# Patient Record
Sex: Female | Born: 1965
Health system: Southern US, Community
[De-identification: ages and names within clinical notes are randomized; demographics above are authoritative.]

## PROBLEM LIST (undated history)

## (undated) DIAGNOSIS — G43909 Migraine, unspecified, not intractable, without status migrainosus: Secondary | ICD-10-CM

## (undated) DIAGNOSIS — J309 Allergic rhinitis, unspecified: Secondary | ICD-10-CM

## (undated) DIAGNOSIS — J069 Acute upper respiratory infection, unspecified: Secondary | ICD-10-CM

## (undated) HISTORY — PX: PARTIAL HYSTERECTOMY: SHX80

## (undated) HISTORY — DX: Acute upper respiratory infection, unspecified: J06.9

## (undated) HISTORY — PX: TONSILLECTOMY: SUR1361

## (undated) HISTORY — DX: Allergic rhinitis, unspecified: J30.9

## (undated) HISTORY — PX: ABDOMINAL HYSTERECTOMY: SHX81

## (undated) HISTORY — PX: BACK SURGERY: SHX140

## (undated) HISTORY — DX: Migraine, unspecified, not intractable, without status migrainosus: G43.909

---

## 2000-11-15 ENCOUNTER — Encounter: Payer: Self-pay | Admitting: Neurosurgery

## 2000-11-15 ENCOUNTER — Ambulatory Visit (HOSPITAL_COMMUNITY): Admission: RE | Admit: 2000-11-15 | Discharge: 2000-11-15 | Payer: Self-pay | Admitting: Neurosurgery

## 2000-12-16 ENCOUNTER — Encounter: Payer: Self-pay | Admitting: Neurosurgery

## 2000-12-16 ENCOUNTER — Inpatient Hospital Stay (HOSPITAL_COMMUNITY): Admission: RE | Admit: 2000-12-16 | Discharge: 2000-12-17 | Payer: Self-pay | Admitting: Neurosurgery

## 2001-02-01 ENCOUNTER — Encounter: Payer: Self-pay | Admitting: Neurosurgery

## 2001-02-01 ENCOUNTER — Ambulatory Visit (HOSPITAL_COMMUNITY): Admission: RE | Admit: 2001-02-01 | Discharge: 2001-02-01 | Payer: Self-pay | Admitting: Neurosurgery

## 2002-02-24 ENCOUNTER — Encounter: Admission: RE | Admit: 2002-02-24 | Discharge: 2002-02-24 | Payer: Self-pay | Admitting: Neurosurgery

## 2002-02-24 ENCOUNTER — Encounter: Payer: Self-pay | Admitting: Neurosurgery

## 2004-11-22 ENCOUNTER — Ambulatory Visit: Payer: Self-pay | Admitting: Internal Medicine

## 2006-02-08 ENCOUNTER — Other Ambulatory Visit: Payer: Self-pay

## 2006-02-08 ENCOUNTER — Emergency Department: Payer: Self-pay | Admitting: Internal Medicine

## 2009-07-26 ENCOUNTER — Ambulatory Visit: Payer: Self-pay | Admitting: Unknown Physician Specialty

## 2009-07-31 ENCOUNTER — Ambulatory Visit: Payer: Self-pay | Admitting: Unknown Physician Specialty

## 2009-10-31 ENCOUNTER — Ambulatory Visit: Payer: Self-pay | Admitting: Neurosurgery

## 2009-12-02 ENCOUNTER — Ambulatory Visit: Payer: Self-pay | Admitting: Anesthesiology

## 2009-12-10 ENCOUNTER — Ambulatory Visit: Payer: Self-pay | Admitting: Anesthesiology

## 2009-12-12 ENCOUNTER — Emergency Department: Payer: Self-pay | Admitting: Emergency Medicine

## 2010-01-02 ENCOUNTER — Ambulatory Visit: Payer: Self-pay | Admitting: Anesthesiology

## 2010-02-03 ENCOUNTER — Ambulatory Visit: Payer: Self-pay | Admitting: Anesthesiology

## 2010-03-11 ENCOUNTER — Ambulatory Visit: Payer: Self-pay | Admitting: Anesthesiology

## 2010-04-16 ENCOUNTER — Ambulatory Visit: Payer: Self-pay | Admitting: Anesthesiology

## 2010-05-06 ENCOUNTER — Ambulatory Visit: Payer: Self-pay | Admitting: Anesthesiology

## 2010-05-20 ENCOUNTER — Ambulatory Visit: Payer: Self-pay | Admitting: Anesthesiology

## 2010-06-10 ENCOUNTER — Ambulatory Visit: Payer: Self-pay | Admitting: Anesthesiology

## 2010-07-14 ENCOUNTER — Ambulatory Visit: Payer: Self-pay | Admitting: Anesthesiology

## 2010-08-06 ENCOUNTER — Ambulatory Visit: Payer: Self-pay | Admitting: Anesthesiology

## 2010-09-04 ENCOUNTER — Ambulatory Visit: Payer: Self-pay | Admitting: Anesthesiology

## 2010-09-17 ENCOUNTER — Ambulatory Visit: Payer: Self-pay | Admitting: Anesthesiology

## 2010-12-16 ENCOUNTER — Ambulatory Visit: Payer: Self-pay | Admitting: Anesthesiology

## 2011-02-09 ENCOUNTER — Ambulatory Visit: Payer: Self-pay | Admitting: Pain Medicine

## 2011-02-16 ENCOUNTER — Ambulatory Visit: Payer: Self-pay | Admitting: Pain Medicine

## 2011-03-04 ENCOUNTER — Ambulatory Visit: Payer: Self-pay | Admitting: Pain Medicine

## 2011-04-07 ENCOUNTER — Ambulatory Visit: Payer: Self-pay | Admitting: Pain Medicine

## 2011-05-07 ENCOUNTER — Ambulatory Visit: Payer: Self-pay | Admitting: Pain Medicine

## 2012-03-22 ENCOUNTER — Ambulatory Visit: Payer: Self-pay | Admitting: Otolaryngology

## 2012-11-21 DIAGNOSIS — E785 Hyperlipidemia, unspecified: Secondary | ICD-10-CM | POA: Insufficient documentation

## 2012-12-05 DIAGNOSIS — M533 Sacrococcygeal disorders, not elsewhere classified: Secondary | ICD-10-CM | POA: Insufficient documentation

## 2012-12-05 DIAGNOSIS — M961 Postlaminectomy syndrome, not elsewhere classified: Secondary | ICD-10-CM | POA: Insufficient documentation

## 2012-12-05 DIAGNOSIS — M542 Cervicalgia: Secondary | ICD-10-CM | POA: Insufficient documentation

## 2013-02-02 ENCOUNTER — Ambulatory Visit: Payer: Self-pay | Admitting: Otolaryngology

## 2013-02-09 DIAGNOSIS — D72829 Elevated white blood cell count, unspecified: Secondary | ICD-10-CM | POA: Insufficient documentation

## 2013-02-09 DIAGNOSIS — J309 Allergic rhinitis, unspecified: Secondary | ICD-10-CM | POA: Insufficient documentation

## 2013-02-28 ENCOUNTER — Encounter: Payer: Self-pay | Admitting: *Deleted

## 2013-02-28 ENCOUNTER — Ambulatory Visit (INDEPENDENT_AMBULATORY_CARE_PROVIDER_SITE_OTHER): Payer: 59 | Admitting: Pulmonary Disease

## 2013-02-28 ENCOUNTER — Encounter: Payer: Self-pay | Admitting: Pulmonary Disease

## 2013-02-28 VITALS — BP 108/70 | HR 110 | Temp 98.4°F | Ht 64.0 in | Wt 129.0 lb

## 2013-02-28 DIAGNOSIS — R059 Cough, unspecified: Secondary | ICD-10-CM

## 2013-02-28 DIAGNOSIS — R011 Cardiac murmur, unspecified: Secondary | ICD-10-CM

## 2013-02-28 DIAGNOSIS — R05 Cough: Secondary | ICD-10-CM | POA: Insufficient documentation

## 2013-02-28 NOTE — Assessment & Plan Note (Addendum)
I agree with Dr. Andee Poles that Megan Melendez's cough is likely due to cyclical or habit cough exacerbated by allergic rhinitis. I am encouraged by the fact that she had completely normal lung function tests today, ruling out airflow obstruction, her exam was normal, and a recent chest x-ray was completely normal.  Notably, she coughed only when I was in the room and really had no cough when I was not present. Further, when I instructed her to stop coughing she is able to do this quite easily. She also notes that if she lays quietly at home she is able to stop the cough.  Is not uncommon for cough to cause further cough in this scenario do to ongoing laryngeal irritation.  Plan: -Strict voice rest for at least 3 days -Tessalon Perles and Delsym to help with cough suppression during this time -Hard candies, warm beverages to help keep her throat moist -Substitute her current antihistamine with chlorpheniramine/phenylephrine temporarily during voice rest -Continue antiallergy medicine otherwise dictated by Dr. Andee Poles -Followup with me in 4-6 weeks if no improvement

## 2013-02-28 NOTE — Progress Notes (Signed)
Subjective:    Patient ID: Megan Melendez, female    DOB: 1966/08/05, 47 y.o.   MRN: [id number]  HPI  This is a very pleasant 47 year old female who comes to our clinic today for evaluation of ongoing cough. She had a normal childhood without respiratory illnesses but around age 75 she developed allergic rhinitis. Since then she's been treating this with various antihistamines. Approximately 10 years ago she developed a cough which lasted for several months. Notably, this was after a very cold winter. She was seen by pulmonologist multiple times and underwent multiple pulmonary function testing and was given multiple rounds of prednisone. She says she thinks that the pulmonologist diagnosed her with asthma but she's not sure. After several months the symptoms went away. Since then she has had recurrent bouts of bronchitis every few years requiring antibiotics. Up until 2014 the last 2-3 years have actually been not too bad for her in this regard.  3 months prior to this visit she developed the sudden onset of cough, shortness of breath, and chest tightness. This symptom progressed and was associated with significant postnasal drip, sinus congestion, and runny nose. She's been seen by her ear nose and throat physician here history her with prednisone, intramuscular Solu-Medrol, and inhaled corticosteroids/long acting bronchodilator. She states that her cough has improved somewhat but is still bothersome to her. She coughs all day long, and frequently at night. She states that there was one cough medication that helped her feel better but she can't recall the name. She knows that she has tried [employer] and test next in the last several weeks. Notably, she states that she can suppress the cough if she lies still at night and rests her voice.  She continues to have ongoing postnasal drip and sinus congestion despite taking antihistamines, nasal steroids, and nasal antihistamines. She does not have  significant shortness of breath anymore but she did have some when the cough was worse there she had a particularly lengthy coughing spell. She has not had chest pain, fever, or weight loss.  She has been around multiple coworkers who have been sick with similar symptoms.  Past Medical History  Diagnosis Date  . Migraine   . Allergic rhinitis      History reviewed. No pertinent family history.   History   Social History  . Marital Status: Legally Separated    Spouse Name: N/A    Number of Children: N/A  . Years of Education: N/A   Occupational History  . Not on file.   Social History Main Topics  . Smoking status: Never Smoker   . Smokeless tobacco: Never Used  . Alcohol Use: No  . Drug Use: No  . Sexually Active: Not on file   Other Topics Concern  . Not on file   Social History Narrative  . No narrative on file     Allergies  Allergen Reactions  . Amoxicillin   . Codeine   . Tramadol      No outpatient prescriptions prior to visit.   No facility-administered medications prior to visit.      Review of Systems  Constitutional: Negative for fever and unexpected weight change.  HENT: Positive for rhinorrhea. Negative for ear pain, nosebleeds, congestion, sore throat, sneezing, trouble swallowing, dental problem, postnasal drip and sinus pressure.   Eyes: Negative for redness and itching.  Respiratory: Positive for cough, chest tightness and shortness of breath. Negative for wheezing.   Cardiovascular: Negative for palpitations and leg swelling.  Gastrointestinal: Negative for nausea and vomiting.  Genitourinary: Negative for dysuria.  Musculoskeletal: Negative for joint swelling.  Skin: Negative for rash.  Neurological: Negative for headaches.  Hematological: Does not bruise/bleed easily.  Psychiatric/Behavioral: Negative for dysphoric mood. The patient is not nervous/anxious.        Objective:   Physical Exam  Filed Vitals:   02/28/13 1500  BP:  108/70  Pulse: 110  Temp: 98.4 F (36.9 C)  TempSrc: Oral  Height: 5\' 4"  (1.626 m)  Weight: 129 lb (58.514 kg)  SpO2: 98%   Gen: frequent cough when I am in the room, clears when I leave, well appearing, no acute distress HEENT: NCAT, PERRL, EOMi, OP clear, neck supple without masses PULM: CTA B CV: RRR, late systolic murmur which increases with inspiration heard best over the third intercostal space in the left, no JVD AB: BS+, soft, nontender, no hsm Ext: warm, no edema, no clubbing, no cyanosis Derm: no rash or skin breakdown Neuro: A&Ox4, CN II-XII intact, strength 5/5 in all 4 extremities  02/28/2013 simple spirometry normal May 2014 chest x-ray normal     Assessment & Plan:   Cough I agree with Dr. Andee Poles that Megan Melendez's cough is likely due to cyclical or habit cough exacerbated by allergic rhinitis. I am encouraged by the fact that she had completely normal lung function tests today, ruling out airflow obstruction, her exam was normal, and a recent chest x-ray was completely normal.  Notably, she coughed only when I was in the room and really had no cough when I was not present. Further, when I instructed her to stop coughing she is able to do this quite easily. She also notes that if she lays quietly at home she is able to stop the cough.  Is not uncommon for cough to cause further cough in this scenario do to ongoing laryngeal irritation.  Plan: -Strict voice rest for at least 3 days -Tessalon Perles and Delsym to help with cough suppression during this time -Hard candies, warm beverages to help keep her throat moist -Substitute her current antihistamine with chlorpheniramine/phenylephrine temporarily during voice rest -Continue antiallergy medicine otherwise dictated by Dr. Andee Poles -Followup with me in 4-6 weeks if no improvement  Heart murmur Gilberto has what sounds like a tricuspid regurg murmur on physical exam today. This is unlikely to be related to her cough and may  in fact turn out to be nothing. She has never had an echocardiogram to evaluate this so we will order that study.    Updated Medication List Outpatient Encounter Prescriptions as of 02/28/2013  Medication Sig Dispense Refill  . albuterol (PROVENTIL HFA;VENTOLIN HFA) 108 (90 BASE) MCG/ACT inhaler Inhale 2 puffs into the lungs every 6 (six) hours as needed for wheezing.      Marland Kitchen ALPRAZolam (XANAX) 0.25 MG tablet Take 0.25 mg by mouth as needed for sleep.      . Azelastine-Fluticasone (DYMISTA) 137-50 MCG/ACT SUSP Place 2 puffs into the nose daily.      . cyclobenzaprine (FLEXERIL) 10 MG tablet Take 10 mg by mouth 3 (three) times daily as needed for muscle spasms.      Marland Kitchen HYDROcodone-acetaminophen (NORCO/VICODIN) 5-325 MG per tablet Take 1 tablet by mouth every 6 (six) hours as needed for pain.      Marland Kitchen levocetirizine (XYZAL) 5 MG tablet Take 5 mg by mouth every evening.      . mometasone-formoterol (DULERA) 200-5 MCG/ACT AERO Inhale 2 puffs into the lungs.      Marland Kitchen  montelukast (SINGULAIR) 10 MG tablet Take 10 mg by mouth at bedtime.      . SUMAtriptan (IMITREX) 100 MG tablet Take 100 mg by mouth every 2 (two) hours as needed for migraine.      . Tapentadol HCl (NUCYNTA) 100 MG TABS Take 1 tablet by mouth daily.      Marland Kitchen topiramate (TOPAMAX) 100 MG tablet Take 100 mg by mouth daily.      Marland Kitchen zolpidem (AMBIEN) 10 MG tablet Take 10 mg by mouth at bedtime as needed for sleep.       No facility-administered encounter medications on file as of 02/28/2013.

## 2013-02-28 NOTE — Assessment & Plan Note (Signed)
Megan Melendez has what sounds like a tricuspid regurg murmur on physical exam today. This is unlikely to be related to her cough and may in fact turn out to be nothing. She has never had an echocardiogram to evaluate this so we will order that study.

## 2013-02-28 NOTE — Patient Instructions (Signed)
You need to break the couging cycle:  Take three days to minimize talking and actively suppress your cough Use the tessalon perles in addition to delsym (over the counter) at home to help suppress your cough During this time substitute the Xysal pills for chlorpheniramine/phenylephrine combination pills used every 4-6 hours as needed for sinus congestion and cough  Use hard candies and warm beverages to sooth your voice  After three days of strict voice rest and cough suppression, you can gradually increase your voice   We will see you back in one month or sooner if needed

## 2013-03-01 ENCOUNTER — Encounter: Payer: Self-pay | Admitting: Pulmonary Disease

## 2013-03-01 ENCOUNTER — Encounter: Payer: Self-pay | Admitting: *Deleted

## 2013-03-02 ENCOUNTER — Other Ambulatory Visit: Payer: 59

## 2013-03-10 ENCOUNTER — Other Ambulatory Visit: Payer: 59

## 2013-03-15 ENCOUNTER — Telehealth: Payer: Self-pay | Admitting: Pulmonary Disease

## 2013-03-15 NOTE — Telephone Encounter (Signed)
ATC PT NA received message the VM has not been set up yet. WCB

## 2013-03-16 ENCOUNTER — Encounter: Payer: Self-pay | Admitting: Internal Medicine

## 2013-03-16 ENCOUNTER — Ambulatory Visit (INDEPENDENT_AMBULATORY_CARE_PROVIDER_SITE_OTHER)
Admission: RE | Admit: 2013-03-16 | Discharge: 2013-03-16 | Disposition: A | Discharge status: Home / Self Care | Payer: 59 | Source: Ambulatory Visit | Attending: Internal Medicine | Admitting: Internal Medicine

## 2013-03-16 ENCOUNTER — Ambulatory Visit (INDEPENDENT_AMBULATORY_CARE_PROVIDER_SITE_OTHER): Payer: 59 | Admitting: Internal Medicine

## 2013-03-16 ENCOUNTER — Encounter: Payer: Self-pay | Admitting: *Deleted

## 2013-03-16 VITALS — BP 118/80 | HR 110 | Temp 97.8°F | Ht 63.5 in | Wt 129.0 lb

## 2013-03-16 DIAGNOSIS — R059 Cough, unspecified: Secondary | ICD-10-CM

## 2013-03-16 DIAGNOSIS — R05 Cough: Secondary | ICD-10-CM

## 2013-03-16 MED ORDER — HYDROCODONE-ACETAMINOPHEN 5-325 MG PO TABS: 1.0000 | ORAL_TABLET | ORAL | Status: DC | PRN

## 2013-03-16 MED ORDER — PREDNISONE (PAK) 10 MG PO TABS: ORAL_TABLET | ORAL | Status: DC

## 2013-03-16 MED ORDER — FAMOTIDINE 20 MG PO TABS: ORAL_TABLET | ORAL | Status: DC

## 2013-03-16 MED ORDER — PANTOPRAZOLE SODIUM 40 MG PO TBEC: 40.0000 mg | DELAYED_RELEASE_TABLET | Freq: Every day | ORAL | Status: DC

## 2013-03-16 NOTE — Telephone Encounter (Signed)
Voicemail box not set up--unable to leave msg @home  # Work # not in service.  WCB

## 2013-03-16 NOTE — Telephone Encounter (Signed)
Pt called again. Apparently her contact # was incorrect. Please call [phone] asap "still coughing a lot". Megan Melendez

## 2013-03-16 NOTE — Patient Instructions (Signed)
Please remember to go to the  x-ray department downstairs for your tests - we will call you with the results when they are available.  The key to effective treatment for your cough is eliminating the non-stop cycle of cough you're stuck in long enough to let your airway heal completely and then see if there is anything still making you cough once you stop the cough suppression, but this should take no more than 5 days to figure out  First take delsym two tsp every 12 hours and supplement if needed with  tramadol 50 mg up to 2 every 4 hours to suppress the urge to cough at all or even clear your throat. Swallowing water or using ice chips/non mint and menthol containing candies (such as lifesavers or sugarless jolly ranchers) are also effective.  You should rest your voice and avoid activities that you know make you cough.  Once you have eliminated the cough for 3 straight days try reducing the tramadol first,  then the delsym as tolerated.    Try prilosec 20mg   Take 30-60 min before first meal of the day and Pepcid 20 mg one bedtime until cough is completely gone for at least a week without the need for cough suppression  I think of reflux for chronic cough like I do oxygen for fire (doesn't cause the fire but once you get the oxygen suppressed it usually goes away regardless of the exact cause).  GERD (REFLUX)  is an extremely common cause of respiratory symptoms, many times with no significant heartburn at all.    It can be treated with medication, but also with lifestyle changes including avoidance of late meals, excessive alcohol, smoking cessation, and avoid fatty foods, chocolate, peppermint, colas, red wine, and acidic juices such as orange juice.  NO MINT OR MENTHOL PRODUCTS SO NO COUGH DROPS  USE SUGARLESS CANDY INSTEAD (jolley ranchers or Stover's)  NO OIL BASED VITAMINS - use powdered substitutes.    Prednisone 10 mg take  4 each am x 2 days,   2 each am x 2 days,  1 each am x 2 days  and stop

## 2013-03-16 NOTE — Telephone Encounter (Signed)
ATC pt and received message the VM has not been set up yet. WCB

## 2013-03-16 NOTE — Progress Notes (Signed)
Subjective:    Patient ID: Megan Melendez, female    DOB: [birth year], 47 y.o.   MRN: [id number]   Brief patient profile:  47 yowf never smoker  around age 26 she developed allergic rhinitis. Since then she's been treating this with various antihistamines. Approximately 10 years ago she developed a cough which lasted for several months. Notably, this was after a very cold winter. She was seen by [facility] multiple times and underwent multiple pulmonary function testing and was given multiple rounds of prednisone. She says she thinks that the pulmonologist diagnosed her with asthma but she's not sure. After several months the symptoms went away. Since then she has had recurrent bouts of bronchitis every few years requiring antibiotics.     02/28/13 initial eval Megan Melendez 3 months prior to  visit she developed the sudden onset of cough, shortness of breath, and chest tightness. This symptom progressed and was associated with significant postnasal drip, sinus congestion, and runny nose. She's been seen by her ear nose and throat physician here history her with prednisone, intramuscular Solu-Medrol, and inhaled corticosteroids/long acting bronchodilator. She states that her cough has improved somewhat but is still bothersome to her. She coughs all day long, and frequently at night. She states that there was one cough medication that helped her feel better but she can't recall the name. She knows that she has tried [employer] and test next in the last several weeks. Notably, she states that she can suppress the cough if she lies still at night and rests her voice. rec You need to break the coughing cycle: Take three days to minimize talking and actively suppress your cough Use the tessalon perles in addition to delsym (over the counter) at home to help suppress your cough During this time substitute the Xysal pills for chlorpheniramine/phenylephrine combination pills used every 4-6 hours as needed for sinus  congestion and cough Use hard candies and warm beverages to sooth your voice After three days of strict voice rest and cough suppression, you can gradually increase your voice    03/16/2013 acute  ov/Megan Melendez   Chief Complaint  Patient presents with  . Acute Visit    Pt c/o increased cough for the past wk. She also c/o left side pain for the past 2 days.    cough only marginally better, remains dry, honking quality, around the clock assoc with urinary incont. No sob unless coughing.  Cp is continuous but much worse with coughing and localized along Lat lower chest wall  No obvious daytime variabilty or assoc   chest tightness, subjective wheeze overt sinus or hb symptoms. No unusual exp hx or h/o childhood pna/ asthma or knowledge of premature birth.    Past Medical History  Diagnosis Date  . Migraine   . Allergic rhinitis         History   Social History  . Marital Status: Legally Separated    Spouse Name: N/A    Number of Children: N/A  . Years of Education: N/A   Occupational History  . Not on file.   Social History Main Topics  . Smoking status: Never Smoker   . Smokeless tobacco: Never Used  . Alcohol Use: No  . Drug Use: No  . Sexually Active: Not on file   Other Topics Concern  . Not on file   Social History Narrative  . No narrative on file     Allergies  Allergen Reactions  . Amoxicillin   . Codeine   .  Tramadol               Objective:   Physical Exam  Distraught amb wf clutching her L chest when coughing  HEENT: NCAT, PERRL, EOMi, OP clear, neck supple without masses PULM:  Completely clear bilaterally  CV: RRR, late systolic murmur which increases with inspiration heard best over the third intercostal space in the left, no JVD AB: BS+, soft, nontender, no hsm Ext: warm, no edema, no clubbing, no cyanosis Derm: no rash or skin breakdown Neuro: A&Ox4, CN II-XII intact, strength 5/5 in all 4 extremities  02/28/2013 simple spirometry  normal May 2014 chest x-ray normal  CXR  03/16/2013 : No active disease       Assessment & Plan:

## 2013-03-16 NOTE — Assessment & Plan Note (Addendum)
The most common causes of chronic cough in immunocompetent adults include the following: upper airway cough syndrome (UACS), previously referred to as postnasal drip syndrome (PNDS), which is caused by variety of rhinosinus conditions; (2) asthma; (3) GERD; (4) chronic bronchitis from cigarette smoking or other inhaled environmental irritants; (5) nonasthmatic eosinophilic bronchitis; and (6) bronchiectasis.   These conditions, singly or in combination, have accounted for up to 94% of the causes of chronic cough in prospective studies.   Other conditions have constituted no >6% of the causes in prospective studies These have included bronchogenic carcinoma, chronic interstitial pneumonia, sarcoidosis, left ventricular failure, ACEI-induced cough, and aspiration from a condition associated with pharyngeal dysfunction.    Chronic cough is often simultaneously caused by more than one condition. A single cause has been found from 38 to 82% of the time, multiple causes from 18 to 62%. Multiply caused cough has been the result of three diseases up to 42% of the time.       This is almost certainly  Classic Upper airway cough syndrome, so named because it's frequently impossible to sort out how much is  CR/sinusitis with freq throat clearing (which can be related to primary GERD)   vs  causing  secondary (" extra esophageal")  GERD from wide swings in gastric pressure that occur with throat clearing, often  promoting self use of mint and menthol lozenges that reduce the lower esophageal sphincter tone and exacerbate the problem further in a cyclical fashion.   These are the same pts (now being labeled as having "irritable larynx syndrome" by some cough centers) who not infrequently have a history of having failed to tolerate ace inhibitors,  dry powder inhalers or biphosphonates or report having atypical reflux symptoms that don't respond to standard doses of PPI , and are easily confused as having aecopd or  asthma flares by even experienced allergists/ pulmonologists.   For now try to eliminate gerd/ cyclical cough x at least 3 days (completely) then regroup on 6/23 if not able to return to work  See instructions for specific recommendations which were reviewed directly with the patient who was given a copy with highlighter outlining the key components.

## 2013-03-16 NOTE — Telephone Encounter (Signed)
i called and spoke with pt. She was coughing the entire time were on the phone. I scheduled her an appt w/ MW at 2:45 in [place] office. She is aware of location.address. Nothing further was needed

## 2013-03-17 ENCOUNTER — Telehealth: Payer: Self-pay | Admitting: Internal Medicine

## 2013-03-17 NOTE — Telephone Encounter (Signed)
Notes Recorded by Nyoka Cowden, MD on 03/16/2013 at 4:38 PM Call pt: Reviewed cxr and no acute change so no change in recommendations made at ov   I spoke with patient about results and she verbalized understanding and had no questions

## 2013-03-28 ENCOUNTER — Other Ambulatory Visit (INDEPENDENT_AMBULATORY_CARE_PROVIDER_SITE_OTHER): Payer: 59

## 2013-03-28 ENCOUNTER — Other Ambulatory Visit: Payer: Self-pay

## 2013-03-28 DIAGNOSIS — R011 Cardiac murmur, unspecified: Secondary | ICD-10-CM

## 2013-03-29 ENCOUNTER — Encounter: Payer: Self-pay | Admitting: Pulmonary Disease

## 2013-04-04 ENCOUNTER — Ambulatory Visit (INDEPENDENT_AMBULATORY_CARE_PROVIDER_SITE_OTHER): Payer: 59 | Admitting: Pulmonary Disease

## 2013-04-04 VITALS — BP 102/72 | HR 88 | Ht 63.5 in | Wt 131.0 lb

## 2013-04-04 DIAGNOSIS — R0789 Other chest pain: Secondary | ICD-10-CM

## 2013-04-04 DIAGNOSIS — R05 Cough: Secondary | ICD-10-CM

## 2013-04-04 DIAGNOSIS — R059 Cough, unspecified: Secondary | ICD-10-CM

## 2013-04-04 NOTE — Progress Notes (Signed)
Subjective:    Patient ID: Megan Melendez, female    DOB: [birth year], 47 y.o.   MRN: [id number]  45 yowf never smoker  around age 70 she developed allergic rhinitis. Since then she's been treating this with various antihistamines. Approximately 10 years ago she developed a cough which lasted for several months. Notably, this was after a very cold winter. She was seen by [facility] multiple times and underwent multiple pulmonary function testing and was given multiple rounds of prednisone. She says she thinks that the pulmonologist diagnosed her with asthma but she's not sure. After several months the symptoms went away. Since then she has had recurrent bouts of bronchitis every few years requiring antibiotics.    HPI  04/04/2013 ROV >> She says that her cough is better since seeing Dr. Sherene Sires.  She was given prednisone, GERD therapy, and was told again to have voice rest.  She stated that the cough nearly completely stopped during the period of voice rest but then started back gradually after she failed to refill her singulair.  Overall the cough is better. Still has nagging pain in her left chest with cough.  Past Medical History  Diagnosis Date  . Migraine   . Allergic rhinitis      Review of Systems     Objective:   Physical Exam Filed Vitals:   04/04/13 0947  BP: 102/72  Pulse: 88  Height: 5' 3.5" (1.613 m)  Weight: 131 lb (59.421 kg)  SpO2: 100%   Gen: well appearing, no acute distress HEENT: NCAT, EOMi, OP clear, PULM: CTA B Chest: point tenderness mid axillary line, approx 5th intercostal space CV: RRR, slight systolic murmur, no JVD AB: BS+, soft, nontender, no hsm Ext: warm, no edema, no clubbing, no cyanosis        Assessment & Plan:   Cough Due to reflux, post nasal drip, and cyclical cough.  She is sniffling in the office today because she stopped her singulair and hence the cough is worse  I educated her on the importance of medication compliance with the GERD and  allergic rhinitis meds.  Plan: -refill singulair (Rx up to date, she hasn't requested a refill) -continue GERD therapy -actively suppress cough -GERD lifestyle modifications  Other chest pain This is musculoskeletal in nature based on normal CXR and tenderness on exam.  Likely due to coughing.  Plan: -scheduled ibuprofen for three days then prn -watch for upset stomach on ibuprofen -stop coughing -use pillow with cough   Updated Medication List Outpatient Encounter Prescriptions as of 04/04/2013  Medication Sig Dispense Refill  . albuterol (PROVENTIL HFA;VENTOLIN HFA) 108 (90 BASE) MCG/ACT inhaler Inhale 2 puffs into the lungs every 6 (six) hours as needed for wheezing.      Marland Kitchen ALPRAZolam (XANAX) 0.25 MG tablet Take 0.25 mg by mouth as needed for sleep.      . Azelastine-Fluticasone (DYMISTA) 137-50 MCG/ACT SUSP Place 2 puffs into the nose daily.      . cyclobenzaprine (FLEXERIL) 10 MG tablet Take 10 mg by mouth 3 (three) times daily as needed for muscle spasms.      . famotidine (PEPCID) 20 MG tablet One at bedtime  30 tablet  0  . HYDROcodone-acetaminophen (NORCO/VICODIN) 5-325 MG per tablet Take 1 tablet by mouth every 4 (four) hours as needed for pain.  30 tablet  0  . montelukast (SINGULAIR) 10 MG tablet Take 10 mg by mouth at bedtime.      . pantoprazole (PROTONIX) 40 MG  tablet Take 1 tablet (40 mg total) by mouth daily. Take 30-60 min before first meal of the day  30 tablet  2  . SUMAtriptan (IMITREX) 100 MG tablet Take 100 mg by mouth every 2 (two) hours as needed for migraine.      . topiramate (TOPAMAX) 100 MG tablet Take 100 mg by mouth daily.      Marland Kitchen zolpidem (AMBIEN) 10 MG tablet Take 10 mg by mouth at bedtime as needed for sleep.      . [DISCONTINUED] predniSONE (STERAPRED UNI-PAK) 10 MG tablet Prednisone 10 mg take  4 each am x 2 days,   2 each am x 2 days,  1 each am x2days and stop  14 tablet  0   No facility-administered encounter medications on file as of 04/04/2013.

## 2013-04-04 NOTE — Assessment & Plan Note (Signed)
This is musculoskeletal in nature based on normal CXR and tenderness on exam.  Likely due to coughing.  Plan: -scheduled ibuprofen for three days then prn -watch for upset stomach on ibuprofen -stop coughing -use pillow with cough

## 2013-04-04 NOTE — Patient Instructions (Signed)
Refill your singulair Use ibuprofen for the chest wall pain and let us know if it does not get better Keep taking the reflux medicines Follow the reflux lifestyle modification handout  Actively try to suppress your cough.  We will see you back in 6 months or sooner if needed.  Let us know if the chest pain does not go away and we will see you back sooner

## 2013-04-04 NOTE — Assessment & Plan Note (Signed)
Due to reflux, post nasal drip, and cyclical cough.  She is sniffling in the office today because she stopped her singulair and hence the cough is worse  I educated her on the importance of medication compliance with the GERD and allergic rhinitis meds.  Plan: -refill singulair (Rx up to date, she hasn't requested a refill) -continue GERD therapy -actively suppress cough -GERD lifestyle modifications

## 2013-04-10 ENCOUNTER — Telehealth: Payer: Self-pay | Admitting: Pulmonary Disease

## 2013-04-10 NOTE — Telephone Encounter (Signed)
Lillia Abed, I think you worked with him on 7-8. Did you receive any FMLA forms from this pt? Were they sent to [employer]? Thanks. Carron Curie, CMA

## 2013-04-11 NOTE — Telephone Encounter (Signed)
Dr Sherene Sires wants McQuaid to fill out the forms, I placed them in his lookat this am

## 2013-04-11 NOTE — Telephone Encounter (Signed)
Per Dr. Kendrick Fries, MW was supposed to fill these papers out. I brought them back to the [place] office on 04/05/2013 and put them in [facility] box.  Verlon Au - can you locate these and have MW fill these out. Thanks.

## 2013-04-11 NOTE — Telephone Encounter (Signed)
Paperwork has been filled out. This will be faxed in today. Pt is aware. Nothing further was needed.

## 2013-04-14 ENCOUNTER — Telehealth: Payer: Self-pay | Admitting: Internal Medicine

## 2013-04-14 NOTE — Telephone Encounter (Signed)
LMTCB

## 2013-04-17 NOTE — Telephone Encounter (Signed)
[email or web address]  

## 2013-04-18 NOTE — Telephone Encounter (Signed)
ATC pt no answer. [email or web address] 

## 2013-04-19 NOTE — Telephone Encounter (Signed)
I spoke with the Megan Melendez and she is upset because what was written on her FMLA caused her to get denied from work for her time off due to her cough. She states when she saw MW in June for an acute visit she told him she would need fmla forms completed and she states he said no problem. However the Megan Melendez did not get forms to [place] until her f/u with BQ(her primary pulmonary doctor) on 03-30-13. Dr. Kendrick Fries completed the forms but did not feel the Megan Melendez needed FMLA due to cough. The Megan Melendez states she has missed so much work due to appts before this that she was close to losing her job as it is and now she does not know what will happen. I asked what we could do and she states she does not know. She has a meeting with HR and will ask them if anything can be done. I advised her to let us know. Carron Curie, CMA

## 2013-08-03 ENCOUNTER — Other Ambulatory Visit: Payer: Self-pay

## 2013-10-19 DIAGNOSIS — Z85828 Personal history of other malignant neoplasm of skin: Secondary | ICD-10-CM | POA: Insufficient documentation

## 2013-12-12 DIAGNOSIS — G43909 Migraine, unspecified, not intractable, without status migrainosus: Secondary | ICD-10-CM | POA: Insufficient documentation

## 2014-04-17 DIAGNOSIS — F32A Depression, unspecified: Secondary | ICD-10-CM | POA: Insufficient documentation

## 2014-04-17 DIAGNOSIS — G4700 Insomnia, unspecified: Secondary | ICD-10-CM | POA: Insufficient documentation

## 2014-04-17 DIAGNOSIS — F329 Major depressive disorder, single episode, unspecified: Secondary | ICD-10-CM | POA: Insufficient documentation

## 2014-04-17 DIAGNOSIS — F419 Anxiety disorder, unspecified: Secondary | ICD-10-CM

## 2014-09-05 DIAGNOSIS — K219 Gastro-esophageal reflux disease without esophagitis: Secondary | ICD-10-CM | POA: Insufficient documentation

## 2014-09-18 DIAGNOSIS — E559 Vitamin D deficiency, unspecified: Secondary | ICD-10-CM | POA: Insufficient documentation

## 2014-09-25 ENCOUNTER — Encounter: Payer: Self-pay | Admitting: Internal Medicine

## 2014-09-25 ENCOUNTER — Ambulatory Visit (INDEPENDENT_AMBULATORY_CARE_PROVIDER_SITE_OTHER)
Admission: RE | Admit: 2014-09-25 | Discharge: 2014-09-25 | Disposition: A | Discharge status: Home / Self Care | Payer: 59 | Source: Ambulatory Visit | Attending: Internal Medicine | Admitting: Internal Medicine

## 2014-09-25 ENCOUNTER — Ambulatory Visit (INDEPENDENT_AMBULATORY_CARE_PROVIDER_SITE_OTHER): Payer: 59 | Admitting: Internal Medicine

## 2014-09-25 DIAGNOSIS — R059 Cough, unspecified: Secondary | ICD-10-CM

## 2014-09-25 DIAGNOSIS — R05 Cough: Secondary | ICD-10-CM

## 2014-09-25 MED ORDER — PANTOPRAZOLE SODIUM 40 MG PO TBEC: 40.0000 mg | DELAYED_RELEASE_TABLET | Freq: Every day | ORAL | Status: DC

## 2014-09-25 MED ORDER — PREDNISONE 10 MG PO TABS: ORAL_TABLET | ORAL | Status: DC

## 2014-09-25 MED ORDER — ACETAMINOPHEN-CODEINE #3 300-30 MG PO TABS: ORAL_TABLET | ORAL | Status: DC

## 2014-09-25 NOTE — Progress Notes (Signed)
Subjective:   Patient ID: Megan Melendez, female    DOB: 1966/03/07     MRN: [id number]   [name]   48 yowf never smoker  around age 48 she developed allergic rhinitis. Since then she's been treating this with various antihistamines. Approximately 10 years ago she developed a cough which lasted for several months. Notably, this was after a very cold winter. She was seen by [facility] multiple times and underwent multiple pulmonary function testing and was given multiple rounds of prednisone. She says she thinks that the pulmonologist diagnosed her with asthma but she's not sure. After several months the symptoms went away. Since then she has had recurrent bouts of bronchitis every few years requiring antibiotics.    HPI  04/04/2013 ROV >> She says that her cough is better since seeing Dr. Melvyn Novas.  She was given prednisone, GERD therapy, and was told again to have voice rest.  She stated that the cough nearly completely stopped during the period of voice rest but then started back gradually after she failed to refill her singulair.  Overall the cough is better. Still has nagging pain in her left chest with cough. rec Cough Due to reflux, post nasal drip, and cyclical cough.  She is sniffling in the office today because she stopped her singulair and hence the cough is worse educated her on the importance of medication compliance with the GERD and allergic rhinitis meds. Plan: -refill singulair (Rx up to date, she hasn't requested a refill) -continue GERD therapy -actively suppress cough -GERD lifestyle modifications Other chest pain This is musculoskeletal in nature based on normal CXR and tenderness on exam.  Likely due to coughing. Plan: -scheduled ibuprofen for three days then prn -watch for upset stomach on ibuprofen -stop coughing -use pillow with cough   09/25/2014 acute  ov/Marquisa Salih re: zyrtec/ambien/xanax  Did not maintain on gerd rx or singulair  Chief Complaint  Patient  presents with  . Acute Visit    pt c/o non productive, no chest tightness, wheezing. Patient has had low grade fever.   2 m prior to OV  Watery rhinitis ant/post day > night  Abrupt onset x 2 weeks/ intially Assoc with R Top tooth infection> UC rx doxy / no better so saw dentist > rx tooth better p flagyl/clindamycin but  cough was not but no fever no nasty mucus    No obvious day to day or daytime variabilty or assoc sob  or cp or chest tightness, subjective wheeze overt sinus or hb symptoms. No unusual exp hx or h/o childhood pna/ asthma or knowledge of premature birth.  Sleeping ok without nocturnal  or early am exacerbation  of respiratory  c/o's or need for noct saba. Also denies any obvious fluctuation of symptoms with weather or environmental changes or other aggravating or alleviating factors except as outlined above   Current Medications, Allergies, Complete Past Medical History, Past Surgical History, Family History, and Social History were reviewed in [employer] record.  ROS  The following are not active complaints unless bolded sore throat, dysphagia, dental problems, itching, sneezing,  nasal congestion or excess/ purulent secretions, ear ache,   fever, chills, sweats, unintended wt loss, pleuritic or exertional cp, hemoptysis,  orthopnea pnd or leg swelling, presyncope, palpitations, heartburn, abdominal pain, anorexia, nausea, vomiting, diarrhea  or change in bowel or urinary habits, change in stools or urine, dysuria,hematuria,  rash, arthralgias, visual complaints, headache, numbness weakness or ataxia or problems with walking or  coordination,  change in mood/affect or memory.            Objective:   Physical Exam   amb hoarse wf with harsh barking quality cough with each breath  Wt Readings from Last 3 Encounters:  04/04/13 131 lb (59.421 kg)  03/16/13 129 lb (58.514 kg)  02/28/13 129 lb (58.514 kg)    Vital signs reviewed   HEENT: nl  dentition, turbinates, and orophanx. Nl external ear canals without cough reflex   NECK :  without JVD/Nodes/TM/ nl carotid upstrokes bilaterally   LUNGS: no acc muscle use, clear to A and P bilaterally without cough on insp or exp maneuvers   CV:  RRR  no s3 or murmur or increase in P2, no edema   ABD:  soft and nontender with nl excursion in the supine position. No bruits or organomegaly, bowel sounds nl  MS:  warm without deformities, calf tenderness, cyanosis or clubbing  SKIN: warm and dry without lesions    NEURO:  alert, approp, no deficits    CXR PA and Lateral:   09/25/2014 :  Mild medial right lung base infiltrate cannot be excluded             Assessment & Plan:

## 2014-09-25 NOTE — Patient Instructions (Addendum)
The key to effective treatment for your cough is eliminating the non-stop cycle of cough you're stuck in long enough to let your airway heal completely and then see if there is anything still making you cough once you stop the cough suppression, but this should take no more than 5 days to figure out  First take delsym two tsp every 12 hours and supplement if needed with  Tylenol #3 up to 2 every 4 hours to suppress the urge to cough at all or even clear your throat. Swallowing water or using ice chips/non mint and menthol containing candies (such as lifesavers or sugarless jolly ranchers) are also effective.  You should rest your voice and avoid activities that you know make you cough.  Once you have eliminated the cough for 3 straight days try reducing the tylenol #3  first,  then the delsym as tolerated.    Prednisone 10 mg take  4 each am x 2 days,   2 each am x 2 days,  1 each am x 2 days and stop (this is to eliminate allergies and inflammation from coughing)  Protonix (pantoprazole) Take 30-60 min before first meal of the day and Pepcid 20 mg one bedtime  until cough is completely gone for at least a week without the need for cough suppression  For drainage take chlortrimeton (chlorpheniramine) 4 mg every 4 hours available over the counter (may cause drowsiness)   GERD (REFLUX)  is an extremely common cause of respiratory symptoms, many times with no significant heartburn at all.    It can be treated with medication, but also with lifestyle changes including avoidance of late meals, excessive alcohol, smoking cessation, and avoid fatty foods, chocolate, peppermint, colas, red wine, and acidic juices such as orange juice.  NO MINT OR MENTHOL PRODUCTS SO NO COUGH DROPS  USE HARD CANDY INSTEAD (jolley ranchers or Stover's or Lifesavers (all available in sugarless versions) NO OIL BASED VITAMINS - use powdered substitutes.    Return in 2 weeks if not 100% better  Late add ? pna > rx levaquin  750 x 5 days and return if not 100% for cxr

## 2014-09-26 ENCOUNTER — Other Ambulatory Visit: Payer: Self-pay | Admitting: Internal Medicine

## 2014-09-26 ENCOUNTER — Encounter: Payer: Self-pay | Admitting: *Deleted

## 2014-09-26 MED ORDER — LEVOFLOXACIN 750 MG PO TABS: 750.0000 mg | ORAL_TABLET | Freq: Every day | ORAL | Status: DC

## 2014-09-26 NOTE — Progress Notes (Signed)
Quick Note:  Spoke with pt and notified of results per Dr. Wert. Pt verbalized understanding and denied any questions.  ______ 

## 2014-09-26 NOTE — Assessment & Plan Note (Signed)
The most common causes of chronic cough in immunocompetent adults include the following: upper airway cough syndrome (UACS), previously referred to as postnasal drip syndrome (PNDS), which is caused by variety of rhinosinus conditions; (2) asthma; (3) GERD; (4) chronic bronchitis from cigarette smoking or other inhaled environmental irritants; (5) nonasthmatic eosinophilic bronchitis; and (6) bronchiectasis.   These conditions, singly or in combination, have accounted for up to 94% of the causes of chronic cough in prospective studies.   Other conditions have constituted no >6% of the causes in prospective studies These have included bronchogenic carcinoma, chronic interstitial pneumonia, sarcoidosis, left ventricular failure, ACEI-induced cough, and aspiration from a condition associated with pharyngeal dysfunction.    Chronic cough is often simultaneously caused by more than one condition. A single cause has been found from 38 to 82% of the time, multiple causes from 18 to 62%. Multiply caused cough has been the result of three diseases up to 42% of the time.       Based on hx and exam, this is most likely:  Classic Upper airway cough syndrome, so named because it's frequently impossible to sort out how much is  CR/sinusitis with freq throat clearing (which can be related to primary GERD)   vs  causing  secondary (" extra esophageal")  GERD from wide swings in gastric pressure that occur with throat clearing, often  promoting self use of mint and menthol lozenges that reduce the lower esophageal sphincter tone and exacerbate the problem further in a cyclical fashion.   These are the same pts (now being labeled as having "irritable larynx syndrome" by some cough centers) who not infrequently have a history of having failed to tolerate ace inhibitors,  dry powder inhalers or biphosphonates or report having atypical reflux symptoms that don't respond to standard doses of PPI , and are easily confused as  having aecopd or asthma flares by even experienced allergists/ pulmonologists.   The first step is to maximize acid suppression and eliminate cyclical coughing then regroup if the cough persists.  See instructions for specific recommendations which were reviewed directly with the patient who was given a copy with highlighter outlining the key components.     Not clear she has pna but has had abx for tooth infection and is allergic to penicillin so rx levaquin 750 x 5 days and return if not 100% for cxr

## 2014-10-03 ENCOUNTER — Telehealth: Payer: Self-pay | Admitting: Internal Medicine

## 2014-10-03 NOTE — Telephone Encounter (Signed)
Called and spoke to pt. Pt stated she completed the levaquin and pred taper and feels worse. Appt made with MW on 10/05/14 at 4:30. Pt verbalized understanding and denied any further questions or concerns at this time.

## 2014-10-05 ENCOUNTER — Ambulatory Visit (INDEPENDENT_AMBULATORY_CARE_PROVIDER_SITE_OTHER): Payer: 59 | Admitting: Internal Medicine

## 2014-10-05 ENCOUNTER — Telehealth: Payer: Self-pay | Admitting: Internal Medicine

## 2014-10-05 ENCOUNTER — Encounter: Payer: Self-pay | Admitting: Internal Medicine

## 2014-10-05 ENCOUNTER — Ambulatory Visit: Payer: Self-pay | Admitting: Internal Medicine

## 2014-10-05 VITALS — BP 104/80 | HR 77 | Temp 98.5°F | Ht 63.5 in | Wt 146.0 lb

## 2014-10-05 DIAGNOSIS — R059 Cough, unspecified: Secondary | ICD-10-CM

## 2014-10-05 DIAGNOSIS — R0789 Other chest pain: Secondary | ICD-10-CM

## 2014-10-05 DIAGNOSIS — R05 Cough: Secondary | ICD-10-CM

## 2014-10-05 MED ORDER — ACETAMINOPHEN-CODEINE #4 300-60 MG PO TABS: 1.0000 | ORAL_TABLET | ORAL | Status: DC | PRN

## 2014-10-05 MED ORDER — PREDNISONE 10 MG PO TABS: ORAL_TABLET | ORAL | Status: DC

## 2014-10-05 NOTE — Progress Notes (Signed)
Subjective:   Patient ID: Megan Melendez, female    DOB: April 22, 1966     MRN: [id number]   Megan Melendez   49 yowf works at [employer]  never smoker  around age 49 she developed allergic rhinitis. Since then she's been treating this with various antihistamines. Approximately2004  she developed a cough which lasted for several months. Notably, this was after a very cold winter. She was seen by [facility] multiple times and underwent multiple pulmonary function testing and was given multiple rounds of prednisone. She says she thinks that the pulmonologist diagnosed her with asthma but she's not sure. After several months the symptoms went away. Since then she has had recurrent bouts of bronchitis every few years requiring antibiotics.    HPI  04/04/2013 ROV >> She says that her cough is better since seeing Dr. Melvyn Novas.  She was given prednisone, GERD therapy, and was told again to have voice rest.  She stated that the cough nearly completely stopped during the period of voice rest but then started back gradually after she failed to refill her singulair.  Overall the cough is better. Still has nagging pain in her left chest with cough. rec Cough Due to reflux, post nasal drip, and cyclical cough.  She is sniffling in the office today because she stopped her singulair and hence the cough is worse educated her on the importance of medication compliance with the GERD and allergic rhinitis meds. Plan: -refill singulair (Rx up to date, she hasn't requested a refill) -continue GERD therapy -actively suppress cough -GERD lifestyle modifications Other chest pain This is musculoskeletal in nature based on normal CXR and tenderness on exam.  Likely due to coughing. Plan: -scheduled ibuprofen for three days then prn -watch for upset stomach on ibuprofen -stop coughing -use pillow with cough   09/25/2014 acute  ov/Wert re: zyrtec/ambien/xanax  Did not maintain on gerd rx or singulair chronically but better  for over a year then relapsed  Chief Complaint  Patient presents with  . Acute Visit    pt c/o non productive, no chest tightness, wheezing. Patient has had low grade fever.   2 m prior to OV  Watery rhinitis ant/post day > night  Abrupt onset x 2 weeks/ intially Assoc with R Top tooth infection> UC rx doxy / no better so saw dentist > rx tooth better p flagyl/clindamycin but  cough was not but no fever no nasty mucus  rec First take delsym two tsp every 12 hours and supplement if needed with  Tylenol #3 up to 2 every 4 hours     Prednisone 10 mg take  4 each am x 2 days,   2 each am x 2 days,  1 each am x 2 days and stop (this is to eliminate allergies and inflammation from coughing) Protonix (pantoprazole) Take 30-60 min before first meal of the day and Pepcid 20 mg one bedtime  until cough is completely gone for at least a week without the need for cough suppression For drainage take chlortrimeton (chlorpheniramine) 4 mg every 4 hours available over the counter (may cause drowsiness)  levaquin 750 x 5 days   10/05/2014 acute  Pulmonary office visit/ Wert   Chief Complaint  Patient presents with  . Acute Visit    Pt last seen 09/25/14 for acute visit. She c/o back pain upon inspiration for the past 4 days. She states that her cough is "different" than before- non prod. She also c/o nasal congestion and  states "I feel drugged".   cough was completely better "then I stopped all the meds and it came back "  - not really clear she followed/ understood all the directions but it does appear she continued the acid suppression rx.  Seen at [place] with clear cxr per pt rx neb/ dx bronchitis "no new meds"   Pain in "back" is generalized post chest and upper back and bilateral and worse with deep breath and cough  No obvious day to day or daytime variabilty or assoc chronic cough or cp or chest tightness, subjective wheeze overt sinus or hb symptoms. No unusual exp hx or h/o childhood pna/ asthma or  knowledge of premature birth.  Sleeping ok without nocturnal  or early am exacerbation  of respiratory  c/o's or need for noct saba. Also denies any obvious fluctuation of symptoms with weather or environmental changes or other aggravating or alleviating factors except as outlined above   Current Medications, Allergies, Complete Past Medical History, Past Surgical History, Family History, and Social History were reviewed in [employer] record.  ROS  The following are not active complaints unless bolded sore throat, dysphagia, dental problems, itching, sneezing,  nasal congestion or excess/ purulent secretions, ear ache,   fever, chills, sweats, unintended wt loss, pleuritic or exertional cp, hemoptysis,  orthopnea pnd or leg swelling, presyncope, palpitations, heartburn, abdominal pain, anorexia, nausea, vomiting, diarrhea  or change in bowel or urinary habits, change in stools or urine, dysuria,hematuria,  rash, arthralgias, visual complaints, headache, numbness weakness or ataxia or problems with walking or coordination,  change in mood/affect or memory.                 Objective:   Physical Exam   amb hoarse wf nad somewhat of a "belle" affect   10/05/14             146 Wt Readings from Last 3 Encounters:  04/04/13 131 lb (59.421 kg)  03/16/13 129 lb (58.514 kg)  02/28/13 129 lb (58.514 kg)    Vital signs reviewed   HEENT: nl dentition, turbinates, and orophanx. Nl external ear canals without cough reflex   NECK :  without JVD/Nodes/TM/ nl carotid upstrokes bilaterally   LUNGS: no acc muscle use, clear to A and P bilaterally without cough on insp or exp maneuvers   CV:  RRR  no s3 or murmur or increase in P2, no edema   ABD:  soft and nontender with nl excursion in the supine position. No bruits or organomegaly, bowel sounds nl  MS:  warm without deformities, calf tenderness, cyanosis or clubbing  SKIN: warm and dry without lesions    NEURO:  alert,  approp, no deficits    CXR PA and Lateral:   09/25/2014 :  Mild medial right lung base infiltrate cannot be excluded  F/u cxr at [facility] "ok" per pt              Assessment & Plan:

## 2014-10-05 NOTE — Patient Instructions (Addendum)
Please see patient coordinator before you leave today  to schedule sinus CT   The key to effective treatment for your cough is eliminating the non-stop cycle of cough you're stuck in long enough to let your airway heal completely and then see if there is anything still making you cough once you stop the cough suppression, but this should take no more than 5 days to figure out  First take delsym two tsp every 12 hours and supplement if needed with  Tylenol #4 up to 2 every 4 hours to suppress the urge to cough at all or even clear your throat. Swallowing water or using ice chips/non mint and menthol containing candies (such as lifesavers or sugarless jolly ranchers) are also effective.  You should rest your voice and avoid activities that you know make you cough.  Once you have eliminated the cough for 3 straight days try reducing the tylenol #4  first,  then the delsym as tolerated.    Prednisone 10 mg take  4 each am x 2 days,   2 each am x 2 days,  1 each am x 2 days and stop (this is to eliminate allergies and inflammation from coughing)  Protonix (pantoprazole) Take 30-60 min before first meal of the day and Pepcid 20 mg one bedtime  And chlortrimeton 4 mg x 2 at bedtime  For drainage take chlortrimeton (chlorpheniramine) 4 mg every 4 hours available over the counter (may cause drowsiness)   GERD (REFLUX)  is an extremely common cause of respiratory symptoms, many times with no significant heartburn at all.    It can be treated with medication, but also with lifestyle changes including avoidance of late meals, excessive alcohol, smoking cessation, and avoid fatty foods, chocolate, peppermint, colas, red wine, and acidic juices such as orange juice.  NO MINT OR MENTHOL PRODUCTS SO NO COUGH DROPS  USE HARD CANDY INSTEAD (jolley ranchers or Stover's or Lifesavers (all available in sugarless versions) NO OIL BASED VITAMINS - use powdered substitutes.      See Tammy NP w/in 2 weeks with all  your medications, even over the counter meds, separated in two separate bags, the ones you take no matter what vs the ones you stop once you feel better and take only as needed when you feel you need them.   Tammy  will generate for you a new user friendly medication calendar that will put [place] all on the same page re: your medication use.

## 2014-10-06 ENCOUNTER — Encounter: Payer: Self-pay | Admitting: Internal Medicine

## 2014-10-06 NOTE — Assessment & Plan Note (Signed)
Recurrent and likely due to [facility] from cough > rx of cough should eliminate the pain

## 2014-10-06 NOTE — Assessment & Plan Note (Signed)
Classic Upper airway cough syndrome, so named because it's frequently impossible to sort out how much is  CR/sinusitis with freq throat clearing (which can be related to primary GERD)   vs  causing  secondary (" extra esophageal")  GERD from wide swings in gastric pressure that occur with throat clearing, often  promoting self use of mint and menthol lozenges that reduce the lower esophageal sphincter tone and exacerbate the problem further in a cyclical fashion.   These are the same pts (now being labeled as having "irritable larynx syndrome" by some cough centers) who not infrequently have a history of having failed to tolerate ace inhibitors,  dry powder inhalers or biphosphonates or report having atypical reflux symptoms that don't respond to standard doses of PPI , and are easily confused as having aecopd or asthma flares by even experienced allergists/ pulmonologists.    No evidence at all of asthma or "bronchitis" at this point and the only remaining issue is whether she might have sinus dz and needs longer course of abx than levaquin rx which is not complete.    Therefore rec repeat cyclical cough regimen with codeine  and completely stop all coughing x 3 days minimum, then see if can come off tylenol #4 first

## 2014-10-08 ENCOUNTER — Telehealth: Payer: Self-pay | Admitting: *Deleted

## 2014-10-08 NOTE — Telephone Encounter (Signed)
error 

## 2014-10-08 NOTE — Telephone Encounter (Signed)
-----   Message from Tanda Rockers, MD sent at 10/06/2014  8:27 AM EST ----- Call and see if feeling better and if not needs to move up sinus ct to asap

## 2014-10-08 NOTE — Telephone Encounter (Signed)
Spoke with the pt and she is set up already set for sinus ct  Nothing further needed

## 2014-10-09 ENCOUNTER — Ambulatory Visit: Payer: 59 | Admitting: Internal Medicine

## 2014-10-09 ENCOUNTER — Other Ambulatory Visit: Payer: 59

## 2014-10-19 ENCOUNTER — Encounter: Payer: 59 | Admitting: Adult Health

## 2014-10-19 ENCOUNTER — Inpatient Hospital Stay: Admission: RE | Admit: 2014-10-19 | Payer: 59 | Source: Ambulatory Visit

## 2014-11-02 ENCOUNTER — Inpatient Hospital Stay: Admission: RE | Admit: 2014-11-02 | Payer: 59 | Source: Ambulatory Visit

## 2014-11-15 ENCOUNTER — Ambulatory Visit (INDEPENDENT_AMBULATORY_CARE_PROVIDER_SITE_OTHER)
Admission: RE | Admit: 2014-11-15 | Discharge: 2014-11-15 | Disposition: A | Discharge status: Home / Self Care | Payer: 59 | Source: Ambulatory Visit | Attending: Internal Medicine | Admitting: Internal Medicine

## 2014-11-15 DIAGNOSIS — K648 Other hemorrhoids: Secondary | ICD-10-CM | POA: Insufficient documentation

## 2014-11-15 DIAGNOSIS — R05 Cough: Secondary | ICD-10-CM

## 2014-11-15 DIAGNOSIS — R059 Cough, unspecified: Secondary | ICD-10-CM

## 2014-11-19 ENCOUNTER — Other Ambulatory Visit: Payer: Self-pay | Admitting: Internal Medicine

## 2014-11-19 DIAGNOSIS — R930 Abnormal findings on diagnostic imaging of skull and head, not elsewhere classified: Secondary | ICD-10-CM

## 2014-11-19 NOTE — Progress Notes (Signed)
Quick Note:  Spoke with pt and notified of results per Dr. Wert. Pt verbalized understanding and denied any questions.  ______ 

## 2014-11-30 ENCOUNTER — Ambulatory Visit: Payer: Self-pay | Admitting: Internal Medicine

## 2014-12-17 ENCOUNTER — Ambulatory Visit: Payer: Self-pay | Admitting: Internal Medicine

## 2015-01-01 DIAGNOSIS — M1611 Unilateral primary osteoarthritis, right hip: Secondary | ICD-10-CM | POA: Insufficient documentation

## 2015-01-21 DIAGNOSIS — G2581 Restless legs syndrome: Secondary | ICD-10-CM | POA: Insufficient documentation

## 2015-09-17 ENCOUNTER — Other Ambulatory Visit: Payer: Self-pay | Admitting: Physical Medicine and Rehabilitation

## 2015-09-17 DIAGNOSIS — M5417 Radiculopathy, lumbosacral region: Secondary | ICD-10-CM

## 2015-10-08 ENCOUNTER — Ambulatory Visit: Payer: 59

## 2015-10-28 ENCOUNTER — Ambulatory Visit: Payer: 59

## 2016-12-14 DIAGNOSIS — D0472 Carcinoma in situ of skin of left lower limb, including hip: Secondary | ICD-10-CM | POA: Diagnosis not present

## 2016-12-14 DIAGNOSIS — Z85828 Personal history of other malignant neoplasm of skin: Secondary | ICD-10-CM | POA: Diagnosis not present

## 2016-12-14 DIAGNOSIS — C44519 Basal cell carcinoma of skin of other part of trunk: Secondary | ICD-10-CM | POA: Diagnosis not present

## 2016-12-14 DIAGNOSIS — D485 Neoplasm of uncertain behavior of skin: Secondary | ICD-10-CM | POA: Diagnosis not present

## 2016-12-31 DIAGNOSIS — G43019 Migraine without aura, intractable, without status migrainosus: Secondary | ICD-10-CM | POA: Diagnosis not present

## 2016-12-31 DIAGNOSIS — G2581 Restless legs syndrome: Secondary | ICD-10-CM | POA: Diagnosis not present

## 2017-01-18 DIAGNOSIS — E7800 Pure hypercholesterolemia, unspecified: Secondary | ICD-10-CM | POA: Diagnosis not present

## 2017-01-18 DIAGNOSIS — E042 Nontoxic multinodular goiter: Secondary | ICD-10-CM | POA: Diagnosis not present

## 2017-01-18 DIAGNOSIS — G4700 Insomnia, unspecified: Secondary | ICD-10-CM | POA: Diagnosis not present

## 2017-01-18 DIAGNOSIS — M542 Cervicalgia: Secondary | ICD-10-CM | POA: Diagnosis not present

## 2017-01-18 DIAGNOSIS — K219 Gastro-esophageal reflux disease without esophagitis: Secondary | ICD-10-CM | POA: Diagnosis not present

## 2017-01-18 DIAGNOSIS — E559 Vitamin D deficiency, unspecified: Secondary | ICD-10-CM | POA: Diagnosis not present

## 2017-02-15 DIAGNOSIS — D0472 Carcinoma in situ of skin of left lower limb, including hip: Secondary | ICD-10-CM | POA: Diagnosis not present

## 2017-02-24 DIAGNOSIS — Z131 Encounter for screening for diabetes mellitus: Secondary | ICD-10-CM | POA: Diagnosis not present

## 2017-02-24 DIAGNOSIS — Z0000 Encounter for general adult medical examination without abnormal findings: Secondary | ICD-10-CM | POA: Diagnosis not present

## 2017-02-24 DIAGNOSIS — E039 Hypothyroidism, unspecified: Secondary | ICD-10-CM | POA: Diagnosis not present

## 2017-02-26 DIAGNOSIS — E039 Hypothyroidism, unspecified: Secondary | ICD-10-CM | POA: Diagnosis not present

## 2017-02-26 DIAGNOSIS — Z131 Encounter for screening for diabetes mellitus: Secondary | ICD-10-CM | POA: Diagnosis not present

## 2017-03-01 DIAGNOSIS — C44519 Basal cell carcinoma of skin of other part of trunk: Secondary | ICD-10-CM | POA: Diagnosis not present

## 2017-03-01 DIAGNOSIS — L449 Papulosquamous disorder, unspecified: Secondary | ICD-10-CM | POA: Diagnosis not present

## 2017-03-01 DIAGNOSIS — D485 Neoplasm of uncertain behavior of skin: Secondary | ICD-10-CM | POA: Diagnosis not present

## 2017-03-29 DIAGNOSIS — Z85828 Personal history of other malignant neoplasm of skin: Secondary | ICD-10-CM | POA: Diagnosis not present

## 2017-03-29 DIAGNOSIS — L570 Actinic keratosis: Secondary | ICD-10-CM | POA: Diagnosis not present

## 2017-04-23 DIAGNOSIS — E559 Vitamin D deficiency, unspecified: Secondary | ICD-10-CM | POA: Diagnosis not present

## 2017-04-23 DIAGNOSIS — M519 Unspecified thoracic, thoracolumbar and lumbosacral intervertebral disc disorder: Secondary | ICD-10-CM | POA: Diagnosis not present

## 2017-04-23 DIAGNOSIS — N951 Menopausal and female climacteric states: Secondary | ICD-10-CM | POA: Diagnosis not present

## 2017-04-23 DIAGNOSIS — E039 Hypothyroidism, unspecified: Secondary | ICD-10-CM | POA: Diagnosis not present

## 2017-04-23 DIAGNOSIS — E7800 Pure hypercholesterolemia, unspecified: Secondary | ICD-10-CM | POA: Diagnosis not present

## 2017-06-15 DIAGNOSIS — H60501 Unspecified acute noninfective otitis externa, right ear: Secondary | ICD-10-CM | POA: Diagnosis not present

## 2017-07-06 DIAGNOSIS — G43019 Migraine without aura, intractable, without status migrainosus: Secondary | ICD-10-CM | POA: Diagnosis not present

## 2017-07-06 DIAGNOSIS — G2581 Restless legs syndrome: Secondary | ICD-10-CM | POA: Diagnosis not present

## 2017-07-13 DIAGNOSIS — C44719 Basal cell carcinoma of skin of left lower limb, including hip: Secondary | ICD-10-CM | POA: Diagnosis not present

## 2017-07-13 DIAGNOSIS — D485 Neoplasm of uncertain behavior of skin: Secondary | ICD-10-CM | POA: Diagnosis not present

## 2017-07-13 DIAGNOSIS — L570 Actinic keratosis: Secondary | ICD-10-CM | POA: Diagnosis not present

## 2017-07-13 DIAGNOSIS — L814 Other melanin hyperpigmentation: Secondary | ICD-10-CM | POA: Diagnosis not present

## 2017-07-13 DIAGNOSIS — L739 Follicular disorder, unspecified: Secondary | ICD-10-CM | POA: Diagnosis not present

## 2017-07-27 DIAGNOSIS — E039 Hypothyroidism, unspecified: Secondary | ICD-10-CM | POA: Diagnosis not present

## 2017-07-27 DIAGNOSIS — E7800 Pure hypercholesterolemia, unspecified: Secondary | ICD-10-CM | POA: Diagnosis not present

## 2017-07-27 DIAGNOSIS — R5383 Other fatigue: Secondary | ICD-10-CM | POA: Diagnosis not present

## 2017-07-27 DIAGNOSIS — E559 Vitamin D deficiency, unspecified: Secondary | ICD-10-CM | POA: Diagnosis not present

## 2017-07-27 DIAGNOSIS — M542 Cervicalgia: Secondary | ICD-10-CM | POA: Diagnosis not present

## 2017-07-27 DIAGNOSIS — G4700 Insomnia, unspecified: Secondary | ICD-10-CM | POA: Diagnosis not present

## 2017-08-31 DIAGNOSIS — C44519 Basal cell carcinoma of skin of other part of trunk: Secondary | ICD-10-CM | POA: Diagnosis not present

## 2017-08-31 DIAGNOSIS — L905 Scar conditions and fibrosis of skin: Secondary | ICD-10-CM | POA: Diagnosis not present

## 2017-10-13 DIAGNOSIS — L570 Actinic keratosis: Secondary | ICD-10-CM | POA: Diagnosis not present

## 2017-10-13 DIAGNOSIS — L820 Inflamed seborrheic keratosis: Secondary | ICD-10-CM | POA: Diagnosis not present

## 2017-10-13 DIAGNOSIS — Z85828 Personal history of other malignant neoplasm of skin: Secondary | ICD-10-CM | POA: Diagnosis not present

## 2017-10-27 DIAGNOSIS — R5383 Other fatigue: Secondary | ICD-10-CM | POA: Diagnosis not present

## 2017-10-27 DIAGNOSIS — E7800 Pure hypercholesterolemia, unspecified: Secondary | ICD-10-CM | POA: Diagnosis not present

## 2017-10-27 DIAGNOSIS — M542 Cervicalgia: Secondary | ICD-10-CM | POA: Diagnosis not present

## 2017-10-27 DIAGNOSIS — E559 Vitamin D deficiency, unspecified: Secondary | ICD-10-CM | POA: Diagnosis not present

## 2017-10-27 DIAGNOSIS — E039 Hypothyroidism, unspecified: Secondary | ICD-10-CM | POA: Diagnosis not present

## 2017-11-26 DIAGNOSIS — C44311 Basal cell carcinoma of skin of nose: Secondary | ICD-10-CM | POA: Insufficient documentation

## 2017-12-15 DIAGNOSIS — L905 Scar conditions and fibrosis of skin: Secondary | ICD-10-CM | POA: Diagnosis not present

## 2017-12-15 DIAGNOSIS — D47Z9 Other specified neoplasms of uncertain behavior of lymphoid, hematopoietic and related tissue: Secondary | ICD-10-CM | POA: Diagnosis not present

## 2017-12-15 DIAGNOSIS — L821 Other seborrheic keratosis: Secondary | ICD-10-CM | POA: Diagnosis not present

## 2017-12-15 DIAGNOSIS — C44311 Basal cell carcinoma of skin of nose: Secondary | ICD-10-CM | POA: Diagnosis not present

## 2017-12-15 DIAGNOSIS — Z209 Contact with and (suspected) exposure to unspecified communicable disease: Secondary | ICD-10-CM | POA: Diagnosis not present

## 2017-12-15 DIAGNOSIS — L570 Actinic keratosis: Secondary | ICD-10-CM | POA: Diagnosis not present

## 2018-01-05 DIAGNOSIS — G2581 Restless legs syndrome: Secondary | ICD-10-CM | POA: Diagnosis not present

## 2018-01-05 DIAGNOSIS — G4700 Insomnia, unspecified: Secondary | ICD-10-CM | POA: Diagnosis not present

## 2018-01-05 DIAGNOSIS — G43019 Migraine without aura, intractable, without status migrainosus: Secondary | ICD-10-CM | POA: Diagnosis not present

## 2018-01-25 DIAGNOSIS — M519 Unspecified thoracic, thoracolumbar and lumbosacral intervertebral disc disorder: Secondary | ICD-10-CM | POA: Diagnosis not present

## 2018-01-25 DIAGNOSIS — E7800 Pure hypercholesterolemia, unspecified: Secondary | ICD-10-CM | POA: Diagnosis not present

## 2018-01-25 DIAGNOSIS — R5383 Other fatigue: Secondary | ICD-10-CM | POA: Diagnosis not present

## 2018-01-25 DIAGNOSIS — M542 Cervicalgia: Secondary | ICD-10-CM | POA: Diagnosis not present

## 2018-01-25 DIAGNOSIS — E559 Vitamin D deficiency, unspecified: Secondary | ICD-10-CM | POA: Diagnosis not present

## 2018-01-25 DIAGNOSIS — E039 Hypothyroidism, unspecified: Secondary | ICD-10-CM | POA: Diagnosis not present

## 2018-03-03 DIAGNOSIS — L578 Other skin changes due to chronic exposure to nonionizing radiation: Secondary | ICD-10-CM | POA: Diagnosis not present

## 2018-03-03 DIAGNOSIS — L814 Other melanin hyperpigmentation: Secondary | ICD-10-CM | POA: Diagnosis not present

## 2018-03-03 DIAGNOSIS — C44311 Basal cell carcinoma of skin of nose: Secondary | ICD-10-CM | POA: Diagnosis not present

## 2018-03-17 DIAGNOSIS — M25562 Pain in left knee: Secondary | ICD-10-CM | POA: Diagnosis not present

## 2018-03-17 DIAGNOSIS — M25561 Pain in right knee: Secondary | ICD-10-CM | POA: Diagnosis not present

## 2018-03-30 DIAGNOSIS — D485 Neoplasm of uncertain behavior of skin: Secondary | ICD-10-CM | POA: Diagnosis not present

## 2018-03-30 DIAGNOSIS — D045 Carcinoma in situ of skin of trunk: Secondary | ICD-10-CM | POA: Diagnosis not present

## 2018-03-30 DIAGNOSIS — L821 Other seborrheic keratosis: Secondary | ICD-10-CM | POA: Diagnosis not present

## 2018-03-30 DIAGNOSIS — L910 Hypertrophic scar: Secondary | ICD-10-CM | POA: Diagnosis not present

## 2018-04-14 DIAGNOSIS — E559 Vitamin D deficiency, unspecified: Secondary | ICD-10-CM | POA: Diagnosis not present

## 2018-04-14 DIAGNOSIS — E7800 Pure hypercholesterolemia, unspecified: Secondary | ICD-10-CM | POA: Diagnosis not present

## 2018-04-14 DIAGNOSIS — Z0000 Encounter for general adult medical examination without abnormal findings: Secondary | ICD-10-CM | POA: Diagnosis not present

## 2018-04-14 DIAGNOSIS — M542 Cervicalgia: Secondary | ICD-10-CM | POA: Diagnosis not present

## 2018-04-14 DIAGNOSIS — E039 Hypothyroidism, unspecified: Secondary | ICD-10-CM | POA: Diagnosis not present

## 2018-04-21 ENCOUNTER — Other Ambulatory Visit: Payer: Self-pay | Admitting: Family Medicine

## 2018-04-21 DIAGNOSIS — Z1231 Encounter for screening mammogram for malignant neoplasm of breast: Secondary | ICD-10-CM

## 2018-04-28 DIAGNOSIS — M25562 Pain in left knee: Secondary | ICD-10-CM | POA: Diagnosis not present

## 2018-04-28 DIAGNOSIS — M25561 Pain in right knee: Secondary | ICD-10-CM | POA: Diagnosis not present

## 2018-05-04 DIAGNOSIS — L570 Actinic keratosis: Secondary | ICD-10-CM | POA: Diagnosis not present

## 2018-05-04 DIAGNOSIS — D485 Neoplasm of uncertain behavior of skin: Secondary | ICD-10-CM | POA: Diagnosis not present

## 2018-06-07 ENCOUNTER — Ambulatory Visit
Admission: RE | Admit: 2018-06-07 | Discharge: 2018-06-07 | Disposition: A | Discharge status: Home / Self Care | Payer: 59 | Source: Ambulatory Visit | Attending: Family Medicine | Admitting: Family Medicine

## 2018-06-07 DIAGNOSIS — Z1231 Encounter for screening mammogram for malignant neoplasm of breast: Secondary | ICD-10-CM | POA: Insufficient documentation

## 2018-07-04 DIAGNOSIS — G43019 Migraine without aura, intractable, without status migrainosus: Secondary | ICD-10-CM | POA: Diagnosis not present

## 2018-07-04 DIAGNOSIS — G4700 Insomnia, unspecified: Secondary | ICD-10-CM | POA: Diagnosis not present

## 2018-07-04 DIAGNOSIS — G2581 Restless legs syndrome: Secondary | ICD-10-CM | POA: Diagnosis not present

## 2018-07-21 DIAGNOSIS — E039 Hypothyroidism, unspecified: Secondary | ICD-10-CM | POA: Diagnosis not present

## 2018-07-21 DIAGNOSIS — E559 Vitamin D deficiency, unspecified: Secondary | ICD-10-CM | POA: Diagnosis not present

## 2018-07-21 DIAGNOSIS — J45909 Unspecified asthma, uncomplicated: Secondary | ICD-10-CM | POA: Diagnosis not present

## 2018-07-21 DIAGNOSIS — R5383 Other fatigue: Secondary | ICD-10-CM | POA: Diagnosis not present

## 2018-07-21 DIAGNOSIS — E7800 Pure hypercholesterolemia, unspecified: Secondary | ICD-10-CM | POA: Diagnosis not present

## 2018-07-25 ENCOUNTER — Ambulatory Visit: Payer: 59 | Admitting: Podiatry

## 2018-07-25 ENCOUNTER — Encounter: Payer: Self-pay | Admitting: Podiatry

## 2018-07-25 ENCOUNTER — Ambulatory Visit (INDEPENDENT_AMBULATORY_CARE_PROVIDER_SITE_OTHER): Payer: 59

## 2018-07-25 VITALS — BP 97/66 | HR 80 | Resp 16

## 2018-07-25 DIAGNOSIS — M722 Plantar fascial fibromatosis: Secondary | ICD-10-CM

## 2018-07-25 MED ORDER — MELOXICAM 15 MG PO TABS: 15.0000 mg | ORAL_TABLET | Freq: Every day | ORAL | 3 refills | Status: DC

## 2018-07-25 MED ORDER — METHYLPREDNISOLONE 4 MG PO TBPK: ORAL_TABLET | ORAL | 0 refills | Status: DC

## 2018-07-25 NOTE — Patient Instructions (Signed)
For instructions on how to put on your Night Splint, please visit [email or web address]   Plantar Fasciitis (Heel Spur Syndrome) with Rehab The plantar fascia is a fibrous, ligament-like, soft-tissue structure that spans the bottom of the foot. Plantar fasciitis is a condition that causes pain in the foot due to inflammation of the tissue. SYMPTOMS   Pain and tenderness on the underneath side of the foot.  Pain that worsens with standing or walking. CAUSES  Plantar fasciitis is caused by irritation and injury to the plantar fascia on the underneath side of the foot. Common mechanisms of injury include:  Direct trauma to bottom of the foot.  Damage to a small nerve that runs under the foot where the main fascia attaches to the heel bone.  Stress placed on the plantar fascia due to bone spurs. RISK INCREASES WITH:   Activities that place stress on the plantar fascia (running, jumping, pivoting, or cutting).  Poor strength and flexibility.  Improperly fitted shoes.  Tight calf muscles.  Flat feet.  Failure to warm-up properly before activity.  Obesity. PREVENTION  Warm up and stretch properly before activity.  Allow for adequate recovery between workouts.  Maintain physical fitness:  Strength, flexibility, and endurance.  Cardiovascular fitness.  Maintain a health body weight.  Avoid stress on the plantar fascia.  Wear properly fitted shoes, including arch supports for individuals who have flat feet.  PROGNOSIS  If treated properly, then the symptoms of plantar fasciitis usually resolve without surgery. However, occasionally surgery is necessary.  RELATED COMPLICATIONS   Recurrent symptoms that may result in a chronic condition.  Problems of the lower back that are caused by compensating for the injury, such as limping.  Pain or weakness of the foot during push-off following surgery.  Chronic inflammation, scarring, and partial or complete fascia tear,  occurring more often from repeated injections.  TREATMENT  Treatment initially involves the use of ice and medication to help reduce pain and inflammation. The use of strengthening and stretching exercises may help reduce pain with activity, especially stretches of the Achilles tendon. These exercises may be performed at home or with a therapist. Your caregiver may recommend that you use heel cups of arch supports to help reduce stress on the plantar fascia. Occasionally, corticosteroid injections are given to reduce inflammation. If symptoms persist for greater than 6 months despite non-surgical (conservative), then surgery may be recommended.   MEDICATION   If pain medication is necessary, then nonsteroidal anti-inflammatory medications, such as aspirin and ibuprofen, or other minor pain relievers, such as acetaminophen, are often recommended.  Do not take pain medication within 7 days before surgery.  Prescription pain relievers may be given if deemed necessary by your caregiver. Use only as directed and only as much as you need.  Corticosteroid injections may be given by your caregiver. These injections should be reserved for the most serious cases, because they may only be given a certain number of times.  HEAT AND COLD  Cold treatment (icing) relieves pain and reduces inflammation. Cold treatment should be applied for 10 to 15 minutes every 2 to 3 hours for inflammation and pain and immediately after any activity that aggravates your symptoms. Use ice packs or massage the area with a piece of ice (ice massage).  Heat treatment may be used prior to performing the stretching and strengthening activities prescribed by your caregiver, physical therapist, or athletic trainer. Use a heat pack or soak the injury in warm water.  SEEK IMMEDIATE MEDICAL CARE   IF:  Treatment seems to offer no benefit, or the condition worsens.  Any medications produce adverse side effects.  EXERCISES- RANGE OF  MOTION (ROM) AND STRETCHING EXERCISES - Plantar Fasciitis (Heel Spur Syndrome) These exercises may help you when beginning to rehabilitate your injury. Your symptoms may resolve with or without further involvement from your physician, physical therapist or athletic trainer. While completing these exercises, remember:   Restoring tissue flexibility helps normal motion to return to the joints. This allows healthier, less painful movement and activity.  An effective stretch should be held for at least 30 seconds.  A stretch should never be painful. You should only feel a gentle lengthening or release in the stretched tissue.  RANGE OF MOTION - Toe Extension, Flexion  Sit with your right / left leg crossed over your opposite knee.  Grasp your toes and gently pull them back toward the top of your foot. You should feel a stretch on the bottom of your toes and/or foot.  Hold this stretch for 10 seconds.  Now, gently pull your toes toward the bottom of your foot. You should feel a stretch on the top of your toes and or foot.  Hold this stretch for 10 seconds. Repeat  times. Complete this stretch 3 times per day.   RANGE OF MOTION - Ankle Dorsiflexion, Active Assisted  Remove shoes and sit on a chair that is preferably not on a carpeted surface.  Place right / left foot under knee. Extend your opposite leg for support.  Keeping your heel down, slide your right / left foot back toward the chair until you feel a stretch at your ankle or calf. If you do not feel a stretch, slide your bottom forward to the edge of the chair, while still keeping your heel down.  Hold this stretch for 10 seconds. Repeat 3 times. Complete this stretch 2 times per day.   STRETCH  Gastroc, Standing  Place hands on wall.  Extend right / left leg, keeping the front knee somewhat bent.  Slightly point your toes inward on your back foot.  Keeping your right / left heel on the floor and your knee straight, shift  your weight toward the wall, not allowing your back to arch.  You should feel a gentle stretch in the right / left calf. Hold this position for 10 seconds. Repeat 3 times. Complete this stretch 2 times per day.  STRETCH  Soleus, Standing  Place hands on wall.  Extend right / left leg, keeping the other knee somewhat bent.  Slightly point your toes inward on your back foot.  Keep your right / left heel on the floor, bend your back knee, and slightly shift your weight over the back leg so that you feel a gentle stretch deep in your back calf.  Hold this position for 10 seconds. Repeat 3 times. Complete this stretch 2 times per day.  STRETCH  Gastrocsoleus, Standing  Note: This exercise can place a lot of stress on your foot and ankle. Please complete this exercise only if specifically instructed by your caregiver.   Place the ball of your right / left foot on a step, keeping your other foot firmly on the same step.  Hold on to the wall or a rail for balance.  Slowly lift your other foot, allowing your body weight to press your heel down over the edge of the step.  You should feel a stretch in your right / left calf.  Hold this position   for 10 seconds.  Repeat this exercise with a slight bend in your right / left knee. Repeat 3 times. Complete this stretch 2 times per day.   STRENGTHENING EXERCISES - Plantar Fasciitis (Heel Spur Syndrome)  These exercises may help you when beginning to rehabilitate your injury. They may resolve your symptoms with or without further involvement from your physician, physical therapist or athletic trainer. While completing these exercises, remember:   Muscles can gain both the endurance and the strength needed for everyday activities through controlled exercises.  Complete these exercises as instructed by your physician, physical therapist or athletic trainer. Progress the resistance and repetitions only as guided.  STRENGTH - Towel Curls  Sit in  a chair positioned on a non-carpeted surface.  Place your foot on a towel, keeping your heel on the floor.  Pull the towel toward your heel by only curling your toes. Keep your heel on the floor. Repeat 3 times. Complete this exercise 2 times per day.  STRENGTH - Ankle Inversion  Secure one end of a rubber exercise band/tubing to a fixed object (table, pole). Loop the other end around your foot just before your toes.  Place your fists between your knees. This will focus your strengthening at your ankle.  Slowly, pull your big toe up and in, making sure the band/tubing is positioned to resist the entire motion.  Hold this position for 10 seconds.  Have your muscles resist the band/tubing as it slowly pulls your foot back to the starting position. Repeat 3 times. Complete this exercises 2 times per day.  Document Released: 09/14/2005 Document Revised: 12/07/2011 Document Reviewed: 12/27/2008 [employer] Patient Information 2014 [employer].  

## 2018-07-26 NOTE — Progress Notes (Signed)
  Subjective:  Patient ID: Megan Melendez, female    DOB: 04/14/1966,  MRN: [id number] HPI Chief Complaint  Patient presents with  . Foot Pain    Plantar heel bilateral - aching x couple months, AM pain, works at a standing desk due to back problems, no treatment  . New Patient (Initial Visit)    52 y.o. female presents with the above complaint.   ROS: Denies fever chills nausea vomiting muscle aches pains calf pain back pain chest pain shortness of breath.  Past Medical History:  Diagnosis Date  . Allergic rhinitis   . Migraine    Past Surgical History:  Procedure Laterality Date  . BACK SURGERY    . PARTIAL HYSTERECTOMY      Current Outpatient Medications:  .  ibuprofen (ADVIL,MOTRIN) 800 MG tablet, Take by mouth., Disp: , Rfl:  .  levothyroxine (SYNTHROID, LEVOTHROID) 125 MCG tablet, levothyroxine 125 mcg tablet  TK 1 T PO QD, Disp: , Rfl:  .  meloxicam (MOBIC) 15 MG tablet, Take 1 tablet (15 mg total) by mouth daily., Disp: 30 tablet, Rfl: 3 .  methylPREDNISolone (MEDROL DOSEPAK) 4 MG TBPK tablet, 6 day dose pack - take as directed, Disp: 21 tablet, Rfl: 0 .  temazepam (RESTORIL) 30 MG capsule, TK ONE C PO NIGHTLY PRF SLP, Disp: , Rfl: 5 .  topiramate (TOPAMAX) 100 MG tablet, Take 100 mg by mouth daily., Disp: , Rfl:   Allergies  Allergen Reactions  . Penicillins Other (See Comments) and Rash  . Amoxicillin   . Tramadol    Review of Systems Objective:   Vitals:   07/25/18 1439  BP: 97/66  Pulse: 80  Resp: 16    General: Well developed, nourished, in no acute distress, alert and oriented x3   Dermatological: Skin is warm, dry and supple bilateral. Nails x 10 are well maintained; remaining integument appears unremarkable at this time. There are no open sores, no preulcerative lesions, no rash or signs of infection present.  Vascular: Dorsalis Pedis artery and Posterior Tibial artery pedal pulses are 2/4 bilateral with immedate capillary fill time. Pedal hair growth  present. No varicosities and no lower extremity edema present bilateral.   Neruologic: Grossly intact via light touch bilateral. Vibratory intact via tuning fork bilateral. Protective threshold with Semmes Wienstein monofilament intact to all pedal sites bilateral. Patellar and Achilles deep tendon reflexes 2+ bilateral. No Babinski or clonus noted bilateral.   Musculoskeletal: No gross boney pedal deformities bilateral. No pain, crepitus, or limitation noted with foot and ankle range of motion bilateral. Muscular strength 5/5 in all groups tested bilateral.  Gait: Unassisted, Nonantalgic.    Radiographs:  Radiographs taken today demonstrate soft tissue increase in density plantar calcaneal surgeon site bilateral heels with plantar distally oriented calcaneal spurs.  Assessment & Plan:   Assessment: Plantar fasciitis bilateral.  Plan: Discussed etiology pathology conservative surgical therapies at this point after sterile Betadine skin prep I injected 20 mg Kenalog 5 mg Marcaine point maximal tenderness bilateral heels.  Tolerated procedure well without complications.  Start her on a Medrol Dosepak to be followed by meloxicam.  Placed in a plantar fascial brace bilaterally and a single night splint.  Tolerated procedure well without complications I will follow-up with her in 1 month.     Clarie Camey T. Garten, [place]

## 2018-08-03 DIAGNOSIS — D0462 Carcinoma in situ of skin of left upper limb, including shoulder: Secondary | ICD-10-CM | POA: Diagnosis not present

## 2018-08-03 DIAGNOSIS — L449 Papulosquamous disorder, unspecified: Secondary | ICD-10-CM | POA: Diagnosis not present

## 2018-08-03 DIAGNOSIS — L821 Other seborrheic keratosis: Secondary | ICD-10-CM | POA: Diagnosis not present

## 2018-08-03 DIAGNOSIS — L570 Actinic keratosis: Secondary | ICD-10-CM | POA: Diagnosis not present

## 2018-08-03 DIAGNOSIS — D485 Neoplasm of uncertain behavior of skin: Secondary | ICD-10-CM | POA: Diagnosis not present

## 2018-08-22 ENCOUNTER — Ambulatory Visit: Payer: 59 | Admitting: Podiatry

## 2018-08-31 DIAGNOSIS — L570 Actinic keratosis: Secondary | ICD-10-CM | POA: Diagnosis not present

## 2018-08-31 DIAGNOSIS — D485 Neoplasm of uncertain behavior of skin: Secondary | ICD-10-CM | POA: Diagnosis not present

## 2018-08-31 DIAGNOSIS — C44719 Basal cell carcinoma of skin of left lower limb, including hip: Secondary | ICD-10-CM | POA: Diagnosis not present

## 2018-08-31 DIAGNOSIS — C441122 Basal cell carcinoma of skin of right lower eyelid, including canthus: Secondary | ICD-10-CM | POA: Diagnosis not present

## 2018-08-31 DIAGNOSIS — D0462 Carcinoma in situ of skin of left upper limb, including shoulder: Secondary | ICD-10-CM | POA: Diagnosis not present

## 2018-10-12 DIAGNOSIS — L570 Actinic keratosis: Secondary | ICD-10-CM | POA: Diagnosis not present

## 2018-10-12 DIAGNOSIS — Z85828 Personal history of other malignant neoplasm of skin: Secondary | ICD-10-CM | POA: Diagnosis not present

## 2018-10-12 DIAGNOSIS — L905 Scar conditions and fibrosis of skin: Secondary | ICD-10-CM | POA: Diagnosis not present

## 2018-10-12 DIAGNOSIS — C44719 Basal cell carcinoma of skin of left lower limb, including hip: Secondary | ICD-10-CM | POA: Diagnosis not present

## 2018-10-12 DIAGNOSIS — I739 Peripheral vascular disease, unspecified: Secondary | ICD-10-CM | POA: Diagnosis not present

## 2018-10-13 DIAGNOSIS — E559 Vitamin D deficiency, unspecified: Secondary | ICD-10-CM | POA: Diagnosis not present

## 2018-10-13 DIAGNOSIS — E039 Hypothyroidism, unspecified: Secondary | ICD-10-CM | POA: Diagnosis not present

## 2018-10-13 DIAGNOSIS — J209 Acute bronchitis, unspecified: Secondary | ICD-10-CM | POA: Diagnosis not present

## 2018-12-01 DIAGNOSIS — C441121 Basal cell carcinoma of skin of right upper eyelid, including canthus: Secondary | ICD-10-CM | POA: Diagnosis not present

## 2018-12-15 DIAGNOSIS — C441122 Basal cell carcinoma of skin of right lower eyelid, including canthus: Secondary | ICD-10-CM | POA: Diagnosis not present

## 2018-12-16 DIAGNOSIS — Z9889 Other specified postprocedural states: Secondary | ICD-10-CM | POA: Diagnosis not present

## 2018-12-16 DIAGNOSIS — E785 Hyperlipidemia, unspecified: Secondary | ICD-10-CM | POA: Diagnosis not present

## 2018-12-16 DIAGNOSIS — C441122 Basal cell carcinoma of skin of right lower eyelid, including canthus: Secondary | ICD-10-CM | POA: Diagnosis not present

## 2018-12-16 DIAGNOSIS — C44111 Basal cell carcinoma of skin of unspecified eyelid, including canthus: Secondary | ICD-10-CM | POA: Diagnosis not present

## 2019-01-03 DIAGNOSIS — G43109 Migraine with aura, not intractable, without status migrainosus: Secondary | ICD-10-CM | POA: Diagnosis not present

## 2019-01-03 DIAGNOSIS — G4700 Insomnia, unspecified: Secondary | ICD-10-CM | POA: Diagnosis not present

## 2019-01-12 DIAGNOSIS — E042 Nontoxic multinodular goiter: Secondary | ICD-10-CM | POA: Diagnosis not present

## 2019-01-12 DIAGNOSIS — M542 Cervicalgia: Secondary | ICD-10-CM | POA: Diagnosis not present

## 2019-01-12 DIAGNOSIS — E039 Hypothyroidism, unspecified: Secondary | ICD-10-CM | POA: Diagnosis not present

## 2019-01-12 DIAGNOSIS — J309 Allergic rhinitis, unspecified: Secondary | ICD-10-CM | POA: Diagnosis not present

## 2019-03-01 DIAGNOSIS — E039 Hypothyroidism, unspecified: Secondary | ICD-10-CM | POA: Insufficient documentation

## 2019-05-22 ENCOUNTER — Other Ambulatory Visit: Payer: Self-pay | Admitting: Internal Medicine

## 2019-05-22 DIAGNOSIS — Z1231 Encounter for screening mammogram for malignant neoplasm of breast: Secondary | ICD-10-CM

## 2019-06-28 ENCOUNTER — Ambulatory Visit
Admission: RE | Admit: 2019-06-28 | Discharge: 2019-06-28 | Disposition: A | Discharge status: Home / Self Care | Payer: 59 | Source: Ambulatory Visit | Attending: Internal Medicine | Admitting: Internal Medicine

## 2019-06-28 DIAGNOSIS — Z1231 Encounter for screening mammogram for malignant neoplasm of breast: Secondary | ICD-10-CM | POA: Insufficient documentation

## 2019-08-02 ENCOUNTER — Encounter: Payer: Self-pay | Admitting: Internal Medicine

## 2019-08-02 ENCOUNTER — Ambulatory Visit: Payer: 59 | Admitting: Internal Medicine

## 2019-08-02 ENCOUNTER — Other Ambulatory Visit: Payer: Self-pay

## 2019-08-02 DIAGNOSIS — R059 Cough, unspecified: Secondary | ICD-10-CM

## 2019-08-02 DIAGNOSIS — R05 Cough: Secondary | ICD-10-CM | POA: Diagnosis not present

## 2019-08-02 MED ORDER — ACETAMINOPHEN-CODEINE #3 300-30 MG PO TABS: 1.0000 | ORAL_TABLET | ORAL | 0 refills | Status: DC | PRN

## 2019-08-02 MED ORDER — PREDNISONE 10 MG PO TABS: ORAL_TABLET | ORAL | 0 refills | Status: DC

## 2019-08-02 MED ORDER — FAMOTIDINE 20 MG PO TABS: ORAL_TABLET | ORAL | 11 refills | Status: DC

## 2019-08-02 MED ORDER — PANTOPRAZOLE SODIUM 40 MG PO TBEC: 40.0000 mg | DELAYED_RELEASE_TABLET | Freq: Every day | ORAL | 2 refills | Status: DC

## 2019-08-02 NOTE — Patient Instructions (Addendum)
The key to effective treatment for your cough is eliminating the non-stop cycle of cough you're stuck in long enough to let your airway heal completely and then see if there is anything still making you cough once you stop the cough suppression, but this should take no more than 5 days to figure out  First take delsym two tsp every 12 hours and supplement if needed with  Tylenol #3 up to 2 every 4 hours to suppress the urge to cough at all or even clear your throat. Swallowing water or using ice chips/non mint and menthol containing candies (such as lifesavers or sugarless jolly ranchers) are also effective.  You should rest your voice and avoid activities that you know make you cough.  Once you have eliminated the cough for 3 straight days try reducing the tylenol #3  first,  then the delsym as tolerated.    Prednisone 10 mg take  4 each am x 2 days,   2 each am x 2 days,  1 each am x 2 days and stop (this is to eliminate allergies and inflammation from coughing)  Protonix (pantoprazole) Take 30-60 min before first meal of the day and Pepcid 20 mg one bedtime  And chlortrimeton 4 mg x 2 at bedtime  For drainage take chlortrimeton (chlorpheniramine/  Chlortab)  4 mg every 4 hours available over the counter (may cause drowsiness)   GERD (REFLUX)  is an extremely common cause of respiratory symptoms, many times with no significant heartburn at all.    It can be treated with medication, but also with lifestyle changes including avoidance of late meals, excessive alcohol, smoking cessation, and avoid fatty foods, chocolate, peppermint, colas, red wine, and acidic juices such as orange juice.  NO MINT OR MENTHOL PRODUCTS SO NO COUGH DROPS   USE HARD CANDY INSTEAD (jolley ranchers or Stover's or Lifesavers (all available in sugarless versions) NO OIL BASED VITAMINS - use powdered substitutes.   Please schedule a follow up office visit in 4 weeks, sooner if needed  with all medications /inhalers/  solutions in hand so we can verify exactly what you are taking. This includes all medications from all doctors and over the counters

## 2019-08-02 NOTE — Progress Notes (Signed)
Subjective:   Patient ID: Megan Melendez, female    DOB: Jun 26, 1966     MRN: [id number]   Megan Melendez   41  yowf works at [employer]  never smoker  around age 53 she developed allergic rhinitis. Since then she's been treating this with various antihistamines. Approximately2004  she developed a cough which lasted for several months. Notably, this was after a very cold winter. She was seen by [facility] multiple times and underwent multiple pulmonary function testing and was given multiple rounds of prednisone. She says she thinks that the pulmonologist diagnosed her with asthma but she's not sure. After several months the symptoms went away. Since then she has had recurrent bouts of bronchitis every few years requiring antibiotics.    HPI  04/04/2013 ROV >> She says that her cough is better since seeing Dr. Melvyn Melendez.  She was given prednisone, GERD therapy, and was told again to have voice rest.  She stated that the cough nearly completely stopped during the period of voice rest but then started back gradually after she failed to refill her singulair.  Overall the cough is better. Still has nagging pain in her left chest with cough. rec Cough Due to reflux, post nasal drip, and cyclical cough.  She is sniffling in the office today because she stopped her singulair and hence the cough is worse educated her on the importance of medication compliance with the GERD and allergic rhinitis meds. Plan: -refill singulair (Rx up to date, she hasn't requested a refill) -continue GERD therapy -actively suppress cough -GERD lifestyle modifications Other chest pain This is musculoskeletal in nature based on normal CXR and tenderness on exam.  Likely due to coughing. Plan: -scheduled ibuprofen for three days then prn -watch for upset stomach on ibuprofen -stop coughing -use pillow with cough   09/25/2014 acute  ov/Megan Melendez re: zyrtec/ambien/xanax  Did not maintain on gerd rx or singulair chronically but better  for over a year then relapsed  Chief Complaint  Patient presents with  . Acute Visit    pt c/o non productive, no chest tightness, wheezing. Patient has had low grade fever.   51 m prior to OV  Watery rhinitis ant/post day > night  Abrupt onset x 2 weeks/ intially Assoc with R Top tooth infection> UC rx doxy / no better so saw dentist > rx tooth better p flagyl/clindamycin but  cough was not but no fever no nasty mucus  rec First take delsym two tsp every 12 hours and supplement if needed with  Tylenol #3 up to 2 every 4 hours     Prednisone 10 mg take  4 each am x 2 days,   2 each am x 2 days,  1 each am x 2 days and stop (this is to eliminate allergies and inflammation from coughing) Protonix (pantoprazole) Take 30-60 min before first meal of the day and Pepcid 20 mg one bedtime  until cough is completely gone for at least a week without the need for cough suppression For drainage take chlortrimeton (chlorpheniramine) 4 mg every 4 hours available over the counter (may cause drowsiness)  levaquin 750 x 5 days   10/05/2014 acute  Pulmonary office visit/ Megan Melendez   Chief Complaint  Patient presents with  . Acute Visit    Pt last seen 09/25/14 for acute visit. She c/o back pain upon inspiration for the past 4 days. She states that her cough is "different" than before- non prod. She also c/o nasal congestion  and states "I feel drugged".   cough was completely better "then I stopped all the meds and it came back "  - not really clear she followed/ understood all the directions but it does appear she continued the acid suppression rx.  Seen at [facility] with clear cxr per pt rx neb/ dx bronchitis "no new meds"  rec Please see patient coordinator before you leave today  to schedule sinus CT First take delsym two tsp every 12 hours and supplement if needed with  Tylenol #4 up to 2 every 4 hours to suppress the urge to cough at all or even clear your throat.  Prednisone 10 mg take  4 each am x 2 days,   2 each  am x 2 days,  1 each am x 2 days and stop (this is to eliminate allergies and inflammation from coughing) Protonix (pantoprazole) Take 30-60 min before first meal of the day and Pepcid 20 mg one bedtime  And chlortrimeton 4 mg x 2 at bedtime For drainage take chlortrimeton (chlorpheniramine) 4 mg every 4 hours available over the counter (may cause drowsiness)  GERD diet  F/u 2 weeks > did not return as 100% better and stayed on singulair only  X months (not ppi rx)   Another stretch of no coughing p mother's dx of lymphoma 51 around 2018      08/02/2019  f/u ov/Megan Melendez re:  Recurrent cough x 6 weeks (onset off gerd rx but on singulair) with "weather change" with  3 visits/ two sets of nl cxrs and no better on breo/saba per pt hx Chief Complaint  Patient presents with  . Consult    Last seen in 2016. She reports her cough has returned but worse. She states she never coughs up anything. Finished a prednisone taper last week. She has tried Firefighter, Event organiser, Mining engineer, singulair, protonix, and famotidine and it has not helped her cough.   Dyspnea:  Ok if not coughing Cough: harsh dry hacking / day >> noct, voice use makes it much worse  Sleeping: ok no cough until stirs in am SABA use: no better with saba or breo  02: none   No obvious day to day or daytime variability or assoc excess/ purulent sputum or mucus plugs or hemoptysis or cp or chest tightness, subjective wheeze or overt sinus or hb symptoms.   Sleeping  without nocturnal  or early am exacerbation  of respiratory  c/o's or need for noct saba. Also denies any obvious fluctuation of symptoms with weather or environmental changes or other aggravating or alleviating factors except as outlined above   No unusual exposure hx or h/o childhood pna/ asthma or knowledge of premature birth.  Current Allergies, Complete Past Medical History, Past Surgical History, Family History, and Social History were reviewed in [employer]  record.  ROS  The following are not active complaints unless bolded Hoarseness, sore throat, dysphagia, dental problems, itching, sneezing,  nasal congestion or discharge of excess mucus or purulent secretions, ear ache,   fever, chills, sweats, unintended wt loss or wt gain, classically pleuritic or exertional cp,  orthopnea pnd or arm/hand swelling  or leg swelling, presyncope, palpitations, abdominal pain, anorexia, nausea, vomiting, diarrhea  or change in bowel habits or change in bladder habits, change in stools or change in urine, dysuria, hematuria,  rash, arthralgias, visual complaints, headache, numbness, weakness or ataxia or problems with walking or coordination,  change in mood or  memory.  Current Meds  Medication Sig  . ibuprofen (ADVIL,MOTRIN) 800 MG tablet Take by mouth.  . levothyroxine (SYNTHROID, LEVOTHROID) 125 MCG tablet levothyroxine 125 mcg tablet  TK 1 T PO QD  . topiramate (TOPAMAX) 100 MG tablet Take 100 mg by mouth daily.                   Objective:   Physical Exam   amb wf nad with classic voice fatigue, severe cough with voice use     08/02/2019        150   10/15/14           146  04/04/13 131 lb (59.421 kg)  03/16/13 129 lb (58.514 kg)  02/28/13 129 lb (58.514 kg)     Vital signs reviewed - Note on arrival 02 sats  97% on RA     HEENT : pt wearing mask not removed for exam due to covid -19 concerns.    NECK :  without JVD/Nodes/TM/ nl carotid upstrokes bilaterally   LUNGS: no acc muscle use,  Nl contour chest which is clear to A and P bilaterally without cough on insp or exp maneuvers   CV:  RRR  no s3 or murmur or increase in P2, and no edema   ABD:  soft and nontender with nl inspiratory excursion in the supine position. No bruits or organomegaly appreciated, bowel sounds nl  MS:  Nl gait/ ext warm without deformities, calf tenderness, cyanosis or clubbing No obvious joint restrictions   SKIN: warm and dry without lesions     NEURO:  alert, approp, nl sensorium with  no motor or cerebellar deficits apparent.         Assessment & Plan:

## 2019-08-02 NOTE — Assessment & Plan Note (Signed)
Never smoker CBC with diff and Allergy profile requested 10/05/14 ([facility])>  ? Never done  - Sinus CT 11/15/14>Localized opacity at the left ostiomeatal unit complex. Elsewhere paranasal sinuses are clear. Nasal turbinate edema without nares obstruction. Rightward deviation of nasal septum. No air-fluid Levels > rec ENT eval > 08/02/2019 pt reports done I [employer] but does not recall who she saw or what was rec - resolved p cyclical cough rx [id number]  - recurred late Sept 2020   Of the three most common causes of  Sub-acute / recurrent or chronic cough, only one (GERD)  can actually contribute to/ trigger  the other two (asthma and post nasal drip syndrome)  and perpetuate the cylce of cough.  While not intuitively obvious, many patients with chronic low grade reflux do not cough until there is a primary insult that disturbs the protective epithelial barrier and exposes sensitive nerve endings.   This is typically viral but can due to PNDS and  either may apply here.     >>> The point is that once this occurs, it is difficult to eliminate the cycle  using anything but a maximally effective acid suppression regimen at least in the short run, accompanied by an appropriate diet to address non acid GERD and control / eliminate the cough itself for at least 3 days with tylenol #3 and eliminate pnds with 1st gen H1 blockers per guidelines and also added 6 days of Prednisone in case of component of Th-2 driven upper or lower airways inflammation (if cough responds short term only to relapse befor return while will on rx for uacs that would point to allergic rhinitis/ asthma or eos bronchitis).   Total time devoted to counseling  > 50 % of initial 60 min office visit:  reviewed case with pt/ discussion of options/alternatives/ personally creating written customized instructions  in presence of pt  then going over those specific  Instructions directly with the pt including how to use all of the meds but in  particular covering each new medication in detail and the difference between the maintenance= "automatic" meds and the prns using an action plan format for the latter (If this problem/symptom => do that organization reading Left to right).  Please see AVS from this visit for a full list of these instructions which I personally wrote for this pt and  are unique to this visit.

## 2019-08-30 ENCOUNTER — Encounter: Payer: Self-pay | Admitting: Internal Medicine

## 2019-08-30 ENCOUNTER — Ambulatory Visit: Payer: 59 | Admitting: Internal Medicine

## 2019-08-30 ENCOUNTER — Other Ambulatory Visit: Payer: Self-pay

## 2019-08-30 DIAGNOSIS — R05 Cough: Secondary | ICD-10-CM | POA: Diagnosis not present

## 2019-08-30 DIAGNOSIS — R059 Cough, unspecified: Secondary | ICD-10-CM

## 2019-08-30 NOTE — Progress Notes (Signed)
Subjective:   Patient ID: Megan Melendez, female    DOB: 1966-06-18     MRN: [id number]   Megan Melendez   53  yowf works at [employer]  never smoker  around age 53 she developed allergic rhinitis. Since then she's been treating this with various antihistamines. Approximately2004  she developed a cough which lasted for several months. Notably, this was after a very cold winter. She was seen by [facility] multiple times and underwent multiple pulmonary function testing and was given multiple rounds of prednisone. She says she thinks that the pulmonologist diagnosed her with asthma but she's not sure. After several months the symptoms went away. Since then she has had recurrent bouts of bronchitis every few years requiring antibiotics.    HPI  04/04/2013 ROV >> She says that her cough is better since seeing Dr. Melvyn Novas.  She was given prednisone, GERD therapy, and was told again to have voice rest.  She stated that the cough nearly completely stopped during the period of voice rest but then started back gradually after she failed to refill her singulair.  Overall the cough is better. Still has nagging pain in her left chest with cough. rec Cough Due to reflux, post nasal drip, and cyclical cough.  She is sniffling in the office today because she stopped her singulair and hence the cough is worse educated her on the importance of medication compliance with the GERD and allergic rhinitis meds. Plan: -refill singulair (Rx up to date, she hasn't requested a refill) -continue GERD therapy -actively suppress cough -GERD lifestyle modifications Other chest pain This is musculoskeletal in nature based on normal CXR and tenderness on exam.  Likely due to coughing. Plan: -scheduled ibuprofen for three days then prn -watch for upset stomach on ibuprofen -stop coughing -use pillow with cough   09/25/2014 acute  ov/Megan Melendez re: zyrtec/ambien/xanax  Did not maintain on gerd rx or singulair chronically but better  for over a year then relapsed  Chief Complaint  Patient presents with  . Acute Visit    pt c/o non productive, no chest tightness, wheezing. Patient has had low grade fever.   2 m prior to OV  Watery rhinitis ant/post day > night  Abrupt onset x 2 weeks/ intially Assoc with R Top tooth infection> UC rx doxy / no better so saw dentist > rx tooth better p flagyl/clindamycin but  cough was not but no fever no nasty mucus  rec First take delsym two tsp every 12 hours and supplement if needed with  Tylenol #3 up to 2 every 4 hours     Prednisone 10 mg take  4 each am x 2 days,   2 each am x 2 days,  1 each am x 2 days and stop (this is to eliminate allergies and inflammation from coughing) Protonix (pantoprazole) Take 30-60 min before first meal of the day and Pepcid 20 mg one bedtime  until cough is completely gone for at least a week without the need for cough suppression For drainage take chlortrimeton (chlorpheniramine) 4 mg every 4 hours available over the counter (may cause drowsiness)  levaquin 750 x 5 days   10/05/2014 acute  Pulmonary office visit/ Megan Melendez   Chief Complaint  Patient presents with  . Acute Visit    Pt last seen 09/25/14 for acute visit. She c/o back pain upon inspiration for the past 4 days. She states that her cough is "different" than before- non prod. She also c/o nasal congestion  and states "I feel drugged".   cough was completely better "then I stopped all the meds and it came back "  - not really clear she followed/ understood all the directions but it does appear she continued the acid suppression rx.  Seen at [facility] with clear cxr per pt rx neb/ dx bronchitis "no new meds"  rec Please see patient coordinator before you leave today  to schedule sinus CT First take delsym two tsp every 12 hours and supplement if needed with  Tylenol #4 up to 2 every 4 hours to suppress the urge to cough at all or even clear your throat.  Prednisone 10 mg take  4 each am x 2 days,   2 each  am x 2 days,  1 each am x 2 days and stop (this is to eliminate allergies and inflammation from coughing) Protonix (pantoprazole) Take 30-60 min before first meal of the day and Pepcid 20 mg one bedtime  And chlortrimeton 4 mg x 2 at bedtime For drainage take chlortrimeton (chlorpheniramine) 4 mg every 4 hours available over the counter (may cause drowsiness)  GERD diet  F/u 2 weeks > did not return as 100% better and stayed on singulair only  X months (not ppi rx)   Another stretch of no coughing p mother's dx of lymphoma around 2018      08/02/2019  f/u ov/Megan Melendez re:  Recurrent cough x 6 weeks (onset off gerd rx but on singulair) with "weather change" with  3 visits/ two sets of nl cxrs and no better on breo/saba per pt hx Chief Complaint  Patient presents with  . Consult    Last seen in 2016. She reports her cough has returned but worse. She states she never coughs up anything. Finished a prednisone taper last week. She has tried Firefighter, Event organiser, Mining engineer, singulair, protonix, and famotidine and it has not helped her cough.   Dyspnea:  Ok if not coughing Cough: harsh dry hacking / day >> noct, voice use makes it much worse  Sleeping: ok no cough until stirs in am SABA use: no better with saba or breo  02: none rec  First take delsym two tsp every 12 hours and supplement if needed with  Tylenol #3 up to 2 every 4 hours to suppress the urge to cough at all or even clear your throat.  Prednisone 10 mg take  4 each am x 2 days,   2 each am x 2 days,  1 each am x 2 days and stop (this is to eliminate allergies and inflammation from coughing) Protonix (pantoprazole) Take 30-60 min before first meal of the day and Pepcid 20 mg one bedtime  And chlortrimeton 4 mg x 2 at bedtime For drainage take chlortrimeton (chlorpheniramine/  Chlortab)  4 mg every 4 hours available over the counter (may cause drowsiness)  GERD diet      08/30/2019  f/u ov/Megan Melendez re: uacs with severe cyclical pattern ? Despite  maint singulair maint  Chief Complaint  Patient presents with  . Follow-up    Cough has resolved and no new co's.   Dyspnea:  Not limited by breathing from desired activities   Cough: gone and did not recur though typically starts back up in   Dec 2020/ pnds much better with 1st gen H1 blockers per guidelines  / no longer on cough meds or codeine Sleeping: ok at night on side  SABA use: no 02: none    No obvious day  to day or daytime variability or assoc excess/ purulent sputum or mucus plugs or hemoptysis or cp or chest tightness, subjective wheeze or overt sinus or hb symptoms.   Sleeping  without nocturnal  or early am exacerbation  of respiratory  c/o's or need for noct saba. Also denies any obvious fluctuation of symptoms with weather or environmental changes or other aggravating or alleviating factors except as outlined above   No unusual exposure hx or h/o childhood pna/ asthma or knowledge of premature birth.  Current Allergies, Complete Past Medical History, Past Surgical History, Family History, and Social History were reviewed in [facility] record.  ROS  The following are not active complaints unless bolded Hoarseness, sore throat, dysphagia, dental problems, itching, sneezing,  nasal congestion or discharge of excess mucus or purulent secretions, ear ache,   fever, chills, sweats, unintended wt loss or wt gain, classically pleuritic or exertional cp,  orthopnea pnd or arm/hand swelling  or leg swelling, presyncope, palpitations, abdominal pain, anorexia, nausea, vomiting, diarrhea  or change in bowel habits or change in bladder habits, change in stools or change in urine, dysuria, hematuria,  rash, arthralgias, visual complaints, headache, numbness, weakness or ataxia or problems with walking or coordination,  change in mood or  memory.        Current Meds  Medication Sig  . chlorpheniramine (CHLOR-TRIMETON) 4 MG tablet Take 4 mg by mouth every 4 (four)  hours as needed for allergies.  . famotidine (PEPCID) 20 MG tablet One after supper  . ibuprofen (ADVIL,MOTRIN) 800 MG tablet Take by mouth.  . levothyroxine (SYNTHROID, LEVOTHROID) 125 MCG tablet levothyroxine 125 mcg tablet  TK 1 T PO QD  . pantoprazole (PROTONIX) 40 MG tablet Take 1 tablet (40 mg total) by mouth daily. Take 30-60 min before first meal of the day  . topiramate (TOPAMAX) 100 MG tablet Take 100 mg by mouth daily.                 Objective:   Physical Exam   Pleasant amb wf very happy with her improvement    08/30/2019        148 08/02/2019        150   10/15/14           146  04/04/13 131 lb (59.421 kg)  03/16/13 129 lb (58.514 kg)  02/28/13 129 lb (58.514 kg)    Vital signs reviewed - Note on arrival 02 sats  99% on RA     HEENT : pt wearing mask not removed for exam due to covid -19 concerns.    NECK :  without JVD/Nodes/TM/ nl carotid upstrokes bilaterally   LUNGS: no acc muscle use,  Nl contour chest which is clear to A and P bilaterally without cough on insp or exp maneuvers  CV:  RRR  no s3 or murmur or increase in P2, and no edema   ABD:  soft and nontender with nl inspiratory excursion in the supine position. No bruits or organomegaly appreciated, bowel sounds nl  MS:  Nl gait/ ext warm without deformities, calf tenderness, cyanosis or clubbing No obvious joint restrictions   SKIN: warm and dry without lesions    NEURO:  alert, approp, nl sensorium with  no motor or cerebellar deficits apparent.        Assessment & Plan:

## 2019-08-30 NOTE — Patient Instructions (Addendum)
No change in plan for the next 3 months (if still taking singulair continue for now though it appears by  Hx  the cough breaks right thru it if she's really on it as maintenance)    Please schedule a follow up visit in 3 months but call sooner if needed

## 2019-08-31 ENCOUNTER — Encounter: Payer: Self-pay | Admitting: Internal Medicine

## 2019-08-31 ENCOUNTER — Telehealth: Payer: Self-pay | Admitting: *Deleted

## 2019-08-31 NOTE — Assessment & Plan Note (Addendum)
Never smoker CBC with diff and Allergy profile requested 10/05/14 ([facility])>  ? Never done  - Sinus CT 11/15/14>Localized opacity at the left ostiomeatal unit complex. Elsewhere paranasal sinuses are clear. Nasal turbinate edema without nares obstruction. Rightward deviation of nasal septum. No air-fluid Levels > rec ENT eval > 08/02/2019 pt reports done I [place] but does not recall who she saw or what was rec - resolved p cyclical cough rx [id number]  - recurred late Sept 2020 ? While still on maint singulair and responded to cyclical cough rx plus 1st gen H1 blockers per guidelines    Adequate control on present rx, reviewed in detail with pt > no change in rx needed  X 3 months to get her thru her usual "bad time of the year" - this includes singulair if she really is taking it as maint though I have doubts it's really adding much if cough breaking right thru it consistently/ reviewed.  I had an extended discussion with the patient reviewing all relevant studies completed to date and  lasting 15 to 20 minutes of a 25 minute visit    Each maintenance medication was reviewed in detail including most importantly the difference between maintenance and prns and under what circumstances the prns are to be triggered using an action plan format that is not reflected in the computer generated alphabetically organized AVS.     Please see AVS for specific instructions unique to this visit that I personally wrote and verbalized to the the pt in detail and then reviewed with pt  by my nurse highlighting any  changes in therapy recommended at today's visit to their plan of care.

## 2019-08-31 NOTE — Telephone Encounter (Signed)
-----   Message from Tanda Rockers, MD sent at 08/31/2019  5:41 AM EST ----- I believe she told me she was still taking singulair - check to see and add back to list if so.

## 2019-08-31 NOTE — Telephone Encounter (Signed)
Spoke with the pt and verified that she is taking the singulair  I have added this back to her med list

## 2019-11-28 ENCOUNTER — Other Ambulatory Visit: Payer: Self-pay

## 2019-11-28 ENCOUNTER — Ambulatory Visit: Payer: 59 | Admitting: Internal Medicine

## 2019-11-28 ENCOUNTER — Encounter: Payer: Self-pay | Admitting: Internal Medicine

## 2019-11-28 DIAGNOSIS — R05 Cough: Secondary | ICD-10-CM

## 2019-11-28 DIAGNOSIS — R059 Cough, unspecified: Secondary | ICD-10-CM

## 2019-11-28 NOTE — Patient Instructions (Addendum)
For drainage / throat tickle/ drippy  try take CHLORPHENIRAMINE  4 mg  (Chlortab 4mg   at [facility] should be easiest to find in the green box)  take one every 4 hours as needed - available over the counter- may cause drowsiness so start with just a bedtime dose or two and see how you tolerate it before trying in daytime    I recommend you try taking the singulair 10 mg each pm x next 2 months since the season you have had trouble with before in the past was Spring   Follow up with allergy as needed

## 2019-11-28 NOTE — Progress Notes (Signed)
Subjective:   Patient ID: Megan Melendez, female    DOB: Jun 26, 1966     MRN: [id number]   [name]   54  yowf works at [employer]  never smoker  around age 54 she developed allergic rhinitis. Since then she's been treating this with various antihistamines. Approximately2004  she developed a cough which lasted for several months. Notably, this was after a very cold winter. She was seen by [facility] multiple times and underwent multiple pulmonary function testing and was given multiple rounds of prednisone. She says she thinks that the pulmonologist diagnosed her with asthma but she's not sure. After several months the symptoms went away. Since then she has had recurrent bouts of bronchitis every few years requiring antibiotics.    HPI  04/04/2013 ROV >> She says that her cough is better since seeing Dr. Melvyn Novas.  She was given prednisone, GERD therapy, and was told again to have voice rest.  She stated that the cough nearly completely stopped during the period of voice rest but then started back gradually after she failed to refill her singulair.  Overall the cough is better. Still has nagging pain in her left chest with cough. rec Cough Due to reflux, post nasal drip, and cyclical cough.  She is sniffling in the office today because she stopped her singulair and hence the cough is worse educated her on the importance of medication compliance with the GERD and allergic rhinitis meds. Plan: -refill singulair (Rx up to date, she hasn't requested a refill) -continue GERD therapy -actively suppress cough -GERD lifestyle modifications Other chest pain This is musculoskeletal in nature based on normal CXR and tenderness on exam.  Likely due to coughing. Plan: -scheduled ibuprofen for three days then prn -watch for upset stomach on ibuprofen -stop coughing -use pillow with cough   09/25/2014 acute  ov/Kevontay Burks re: zyrtec/ambien/xanax  Did not maintain on gerd rx or singulair chronically but better  for over a year then relapsed  Chief Complaint  Patient presents with  . Acute Visit    pt c/o non productive, no chest tightness, wheezing. Patient has had low grade fever.   2 m prior to OV  Watery rhinitis ant/post day > night  Abrupt onset x 2 weeks/ intially Assoc with R Top tooth infection> UC rx doxy / no better so saw dentist > rx tooth better p flagyl/clindamycin but  cough was not but no fever no nasty mucus  rec First take delsym two tsp every 12 hours and supplement if needed with  Tylenol #3 up to 2 every 4 hours     Prednisone 10 mg take  4 each am x 2 days,   2 each am x 2 days,  1 each am x 2 days and stop (this is to eliminate allergies and inflammation from coughing) Protonix (pantoprazole) Take 30-60 min before first meal of the day and Pepcid 20 mg one bedtime  until cough is completely gone for at least a week without the need for cough suppression For drainage take chlortrimeton (chlorpheniramine) 4 mg every 4 hours available over the counter (may cause drowsiness)  levaquin 750 x 5 days   10/05/2014 acute  Pulmonary office visit/ Amir Glaus   Chief Complaint  Patient presents with  . Acute Visit    Pt last seen 09/25/14 for acute visit. She c/o back pain upon inspiration for the past 4 days. She states that her cough is "different" than before- non prod. She also c/o nasal congestion  and states "I feel drugged".   cough was completely better "then I stopped all the meds and it came back "  - not really clear she followed/ understood all the directions but it does appear she continued the acid suppression rx.  Seen at [place] with clear cxr per pt rx neb/ dx bronchitis "no new meds"  rec Please see patient coordinator before you leave today  to schedule sinus CT First take delsym two tsp every 12 hours and supplement if needed with  Tylenol #4 up to 2 every 4 hours to suppress the urge to cough at all or even clear your throat.  Prednisone 10 mg take  4 each am x 2 days,   2 each  am x 2 days,  1 each am x 2 days and stop (this is to eliminate allergies and inflammation from coughing) Protonix (pantoprazole) Take 30-60 min before first meal of the day and Pepcid 20 mg one bedtime  And chlortrimeton 4 mg x 2 at bedtime For drainage take chlortrimeton (chlorpheniramine) 4 mg every 4 hours available over the counter (may cause drowsiness)  GERD diet  F/u 2 weeks > did not return as 100% better and stayed on singulair only  X months (not ppi rx)   Another stretch of no coughing p mother's dx of lymphoma around 2018      08/02/2019  f/u ov/Printice Hellmer re:  Recurrent cough x 6 weeks (onset off gerd rx but on singulair) with "weather change" with  3 visits/ two sets of nl cxrs and no better on breo/saba per pt hx Chief Complaint  Patient presents with  . Consult    Last seen in 2016. She reports her cough has returned but worse. She states she never coughs up anything. Finished a prednisone taper last week. She has tried Firefighter, Event organiser, Mining engineer, singulair, protonix, and famotidine and it has not helped her cough.   Dyspnea:  Ok if not coughing Cough: harsh dry hacking / day >> noct, voice use makes it much worse  Sleeping: ok no cough until stirs in am SABA use: no better with saba or breo  02: none rec  First take delsym two tsp every 12 hours and supplement if needed with  Tylenol #3 up to 2 every 4 hours to suppress the urge to cough at all or even clear your throat.  Prednisone 10 mg take  4 each am x 2 days,   2 each am x 2 days,  1 each am x 2 days and stop (this is to eliminate allergies and inflammation from coughing) Protonix (pantoprazole) Take 30-60 min before first meal of the day and Pepcid 20 mg one bedtime  And chlortrimeton 4 mg x 2 at bedtime For drainage take chlortrimeton (chlorpheniramine/  Chlortab)  4 mg every 4 hours available over the counter (may cause drowsiness)  GERD diet      08/30/2019  f/u ov/Jahki Witham re: uacs with severe cyclical pattern ? Despite  maint singulair maint  Chief Complaint  Patient presents with  . Follow-up    Cough has resolved and no new co's.   Dyspnea:  Not limited by breathing from desired activities   Cough: gone and did not recur though typically starts back up in   Dec 2020/ pnds much better with 1st gen H1 blockers per guidelines  / no longer on cough meds or codeine Sleeping: ok at night on side  SABA use: no 02: none No change in plan for the  next 3 months (if still taking singulair continue for now though it appears by  Hx  the cough breaks right thru it if she's really on it as maintenance)      11/28/2019  f/u ov/Zakhari Fogel re: uacs with severe cyclical pattern / only main is singulair but not consistent with it/ says coming up on worst season now for nasal symptoms Chief Complaint  Patient presents with  . Follow-up    Cough has resolved.    Dyspnea:  Not limited by breathing from desired activities   Cough: none/ using chlorpheniramine twice weekly  Sleeping: ambien SABA use: none  02: none    No obvious day to day or daytime variability or assoc excess/ purulent sputum or mucus plugs or hemoptysis or cp or chest tightness, subjective wheeze or overt sinus or hb symptoms.   sleeping without nocturnal  or early am exacerbation  of respiratory  c/o's or need for noct saba. Also denies any obvious fluctuation of symptoms with weather or environmental changes or other aggravating or alleviating factors except as outlined above   No unusual exposure hx or h/o childhood pna/ asthma or knowledge of premature birth.  Current Allergies, Complete Past Medical History, Past Surgical History, Family History, and Social History were reviewed in [employer] record.  ROS  The following are not active complaints unless bolded Hoarseness, sore throat, dysphagia, dental problems, itching, sneezing,  nasal congestion or discharge of excess mucus or purulent secretions, ear ache,   fever, chills,  sweats, unintended wt loss or wt gain, classically pleuritic or exertional cp,  orthopnea pnd or arm/hand swelling  or leg swelling, presyncope, palpitations, abdominal pain, anorexia, nausea, vomiting, diarrhea  or change in bowel habits or change in bladder habits, change in stools or change in urine, dysuria, hematuria,  rash, arthralgias, visual complaints, headache, numbness, weakness or ataxia or problems with walking or coordination,  change in mood or  memory.        Current Meds  Medication Sig  . atorvastatin (LIPITOR) 20 MG tablet Take 1 tablet by mouth daily.  . chlorpheniramine (CHLOR-TRIMETON) 4 MG tablet Take 4 mg by mouth every 4 (four) hours as needed for allergies.  . famotidine (PEPCID) 20 MG tablet One after supper  . ibuprofen (ADVIL,MOTRIN) 800 MG tablet Take by mouth.  . levothyroxine (SYNTHROID, LEVOTHROID) 125 MCG tablet levothyroxine 125 mcg tablet  TK 1 T PO QD  . montelukast (SINGULAIR) 10 MG tablet Take 10 mg by mouth at bedtime.  . topiramate (TOPAMAX) 100 MG tablet Take 100 mg by mouth daily.  Marland Kitchen zolpidem (AMBIEN) 10 MG tablet Take 1 tablet by mouth at bedtime as needed.                      Objective:   Physical Exam     11/28/2019         150  08/30/2019        148 08/02/2019        150   10/15/14           146  04/04/13 131 lb (59.421 kg)  03/16/13 129 lb (58.514 kg)  02/28/13 129 lb (58.514 kg)     amb wf nad   Vital signs reviewed  11/28/2019  - Note at rest 02 sats  100% on RA     HEENT : pt wearing mask not removed for exam due to covid -19 concerns.    NECK :  without JVD/Nodes/TM/ nl carotid upstrokes bilaterally   LUNGS: no acc muscle use,  Nl contour chest which is clear to A and P bilaterally without cough on insp or exp maneuvers   CV:  RRR  no s3 or murmur or increase in P2, and no edema   ABD:  soft and nontender with nl inspiratory excursion in the supine position. No bruits or organomegaly appreciated, bowel sounds nl  MS:   Nl gait/ ext warm without deformities, calf tenderness, cyanosis or clubbing No obvious joint restrictions   SKIN: warm and dry without lesions    NEURO:  alert, approp, nl sensorium with  no motor or cerebellar deficits apparent.          Assessment & Plan:

## 2019-11-30 ENCOUNTER — Encounter: Payer: Self-pay | Admitting: Internal Medicine

## 2019-11-30 NOTE — Assessment & Plan Note (Addendum)
Never smoker CBC with diff and Allergy profile requested 10/05/14 ([facility])>  ? Never done  - Sinus CT 11/15/14>Localized opacity at the left ostiomeatal unit complex. Elsewhere paranasal sinuses are clear. Nasal turbinate edema without nares obstruction. Rightward deviation of nasal septum. No air-fluid Levels > rec ENT eval > 08/02/2019 pt reports done [facility] but does not recall who she saw or what was rec - resolved p cyclical cough rx [id number]  - recurred late Sept 2020 ? While still on maint singulair and responded to cyclical cough rx plus 1st gen H1 blockers per guidelines    - 11/28/2019 try singulair daily x "the worse time of the year for her allergies" and if not better then rec allergy re-eval   Cough has resolved though hard to be sure what helps the most as pt admits not real good about taking meds consistently enough to be sure  The standardized cough guidelines published in Chest by Lissa Morales in 2006 are still the best available and consist of a multiple step process (up to 12!) , not a single office visit,  and are intended  to address this problem logically,  with an alogrithm dependent on response to empiric treatment at  each progressive step  to determine a specific diagnosis with  minimal addtional testing needed. Therefore if adherence is an issue or can't be accurately verified,  it's very unlikely the standard evaluation and treatment will be successful here.    Furthermore, response to therapy (other than acute cough suppression, which should only be used short term with avoidance of narcotic containing cough syrups if possible), can be a gradual process for which the patient is not likely to  perceive immediate benefit.  Unlike going to an eye doctor where the best perscription is almost always the first one and is immediately effective, this is almost never the case in the management of chronic cough syndromes. Therefore the patient needs to commit up front to  consistently adhere to recommendations  for up to 6 weeks of therapy directed at the likely underlying problem(s) before the response can be reasonably evaluated.    >>>  In this case rec singulair x 2 months and continue prn 1st gen H1 blockers per guidelines  then see allergy if not happy with response and f/u here prn           Each maintenance medication was reviewed in detail including emphasizing most importantly the difference between maintenance and prns and under what circumstances the prns are to be triggered using an action plan format where appropriate.  Total time for H and P, chart review, counseling,   and generating customized AVS unique to this summary final  office visit / charting = 20 min

## 2019-12-01 ENCOUNTER — Ambulatory Visit: Payer: 59 | Attending: Internal Medicine

## 2019-12-01 DIAGNOSIS — Z23 Encounter for immunization: Secondary | ICD-10-CM

## 2019-12-01 NOTE — Progress Notes (Signed)
   Covid-19 Vaccination Clinic  Name:  BRISIA BILLING    MRN: [id number] DOB: 03-19-1966  12/01/2019  Ms. Cuellar was observed post Covid-19 immunization for 15 minutes without incident. She was provided with Vaccine Information Sheet and instruction to access the V-Safe system.   Ms. Popham was instructed to call 911 with any severe reactions post vaccine: Marland Kitchen Difficulty breathing  . Swelling of face and throat  . A fast heartbeat  . A bad rash all over body  . Dizziness and weakness   Immunizations Administered    Name Date Dose VIS Date Route   Pfizer COVID-19 Vaccine 12/01/2019  8:20 AM 0.3 mL 09/08/2019 Intramuscular   Manufacturer: [place]   Lot: [id number]   [place]: [id number]

## 2019-12-23 ENCOUNTER — Ambulatory Visit: Payer: 59

## 2019-12-27 ENCOUNTER — Ambulatory Visit: Payer: 59 | Attending: Internal Medicine

## 2019-12-27 DIAGNOSIS — Z23 Encounter for immunization: Secondary | ICD-10-CM

## 2019-12-27 NOTE — Progress Notes (Signed)
   Covid-19 Vaccination Clinic  Name:  Megan Melendez    MRN: [id number] DOB: 1965-10-07  12/27/2019  [name] was observed post Covid-19 immunization for 15 minutes without incident. She was provided with Vaccine Information Sheet and instruction to access the V-Safe system.   [name] was instructed to call 911 with any severe reactions post vaccine: Marland Kitchen Difficulty breathing  . Swelling of face and throat  . A fast heartbeat  . A bad rash all over body  . Dizziness and weakness   Immunizations Administered    Name Date Dose VIS Date Route   Pfizer COVID-19 Vaccine 12/27/2019  9:37 AM 0.3 mL 09/08/2019 Intramuscular   Manufacturer: [place]   Lot: [id number]   [place]: [id number]

## 2020-02-23 ENCOUNTER — Telehealth: Payer: Self-pay | Admitting: Internal Medicine

## 2020-02-23 MED ORDER — PREDNISONE 10 MG PO TABS: ORAL_TABLET | ORAL | 0 refills | Status: DC

## 2020-02-23 NOTE — Telephone Encounter (Signed)
Patient is returning phone call. Patient phone number is [phone].

## 2020-02-23 NOTE — Telephone Encounter (Signed)
Called and spoke with patient she states that this is her normal dry cough that she usually sees Dr. Melvyn Novas for. States he usually gets in to go away for 4-5 months and then it comes back. She has now had cough for a week. States she is taking her Pepcid in pm and Protonix in am, allergy medicine and chlortab and eating candy like crazy.   Patient is asking for prescription for Tylenol #3 and Prednisone. Dr. Melvyn Novas please advise

## 2020-02-23 NOTE — Telephone Encounter (Signed)
Called and spoke with pt letting her know the info from Dr. Melvyn Novas that he was okay with [place] sending prednisone Rx to pharmacy for her. Pt verbalized understanding. Verified preferred pharmacy and sent Rx in for pt. Also stated to her the other recs per MW.nothing further needed.

## 2020-02-23 NOTE — Telephone Encounter (Signed)
Can do the prednisone s ov but not the narcotic   Try Prednisone 10 mg take  4 each am x 2 days,   2 each am x 2 days,  1 each am x 2 days and stop  Plus:  Assuming still on singulair  Chlorpheniramine 4 mg q 4 h prn throat tickle, allergies, drainage  Double  the protonix to 40 Take 30- 60 min before your first and last meals of the day and use delsym, the strongest non-narcotic then ov if not improved next week  Consider allergy referral ([place])

## 2020-02-23 NOTE — Telephone Encounter (Signed)
Attempted to call pt but unable to reach. Left message for pt to return call. 

## 2020-03-12 ENCOUNTER — Telehealth: Payer: Self-pay | Admitting: Primary Care

## 2020-03-12 ENCOUNTER — Ambulatory Visit (INDEPENDENT_AMBULATORY_CARE_PROVIDER_SITE_OTHER): Payer: 59 | Admitting: Primary Care

## 2020-03-12 ENCOUNTER — Other Ambulatory Visit: Payer: Self-pay

## 2020-03-12 DIAGNOSIS — R05 Cough: Secondary | ICD-10-CM

## 2020-03-12 DIAGNOSIS — R058 Other specified cough: Secondary | ICD-10-CM

## 2020-03-12 DIAGNOSIS — J309 Allergic rhinitis, unspecified: Secondary | ICD-10-CM

## 2020-03-12 MED ORDER — PROMETHAZINE-CODEINE 6.25-10 MG/5ML PO SYRP: 5.0000 mL | ORAL_SOLUTION | Freq: Four times a day (QID) | ORAL | 0 refills | Status: AC | PRN

## 2020-03-12 MED ORDER — AZELASTINE HCL 0.1 % NA SOLN: 1.0000 | Freq: Two times a day (BID) | NASAL | 12 refills | Status: AC

## 2020-03-12 MED ORDER — PROMETHAZINE-CODEINE 6.25-10 MG/5ML PO SYRP: 5.0000 mL | ORAL_SOLUTION | Freq: Four times a day (QID) | ORAL | 0 refills | Status: DC | PRN

## 2020-03-12 NOTE — Telephone Encounter (Signed)
Referral to allergist Dr. Carmelina Peal.  Also, she would like to try nasal irrigation system called Navage. Can we see if we can send an order for this into a DME. If not do we have coupons on the back of the netti pot flyer she can give her

## 2020-03-12 NOTE — Telephone Encounter (Signed)
Orders have been placed per [facility].

## 2020-03-12 NOTE — Progress Notes (Signed)
Virtual Visit via Telephone Note  I connected with Megan Melendez on 03/12/20 at 10:00 AM EDT by telephone and verified that I am speaking with the correct person using two identifiers.  Location: Patient: Home Provider: Office   I discussed the limitations, risks, security and privacy concerns of performing an evaluation and management service by telephone and the availability of in person appointments. I also discussed with the patient that there may be a patient responsible charge related to this service. The patient expressed understanding and agreed to proceed.   History of Present Illness: 54 year old female, never smoked. PMH significant for UACS. Patient of Dr. Melvyn Novas, last seen in March 2021. Maintained on Protonix, Pepcid and Singulair.    03/12/2020 Patient contacted today for acute televisit with reports of dry cough, worse with allergy season. Recently sent in prednisone taper by Dr. Melvyn Novas for cough in May which did not help. She is taking Chlor-tabs 3-4 times a day for her cough. She tried Delsym but reports it is very expensive. She states that she is compliant with Protonix daily in the morning, Pepcid 20mg  and Singulair 10mg  at bedtime. She does not particularly like nasal sprays. She would like to try nasal irrigation system called Navage. No fever, shortness of breath, chest pain, N/V/D.   Observations/Objective:  - Cyclic cough, worse when speaking. Some upper airway congestion/post nasal drip  - No overt shortness of breath or wheezing   Assessment and Plan: Persistent dry upper airway cough worse during allergy season, follows closely with Dr. Melvyn Novas for this. She is compliant with GERD and allergy medication. She does not like nasal sprays but is willing to try nasal irrigation. NO improvement with recent oral prednisone course given at the end of May   UACS with severe cyclical pattern : - Continue Protonix 20mg  1 hour before first meal of the day, Pepcid 20mg  at bedtime  and Singulair 10mg  at bedtime  - Recommend patient use saline nasal lavage twice daily (we will try and place an order for nasal irrigation system called Navage with DME) - Rx PWC 5ml every 6 hours for cough suppression (PMP reviewed, no unexpected prescription found. Review safe use, patient is to use sparingly and taper off after eliminating cough for 3 days. No refills at this time)  - Encourage voice rest and avoid coughing with sips of water/sugar free lozenges  - Refer to allergy/ Dr. Carmelina Peal per Dr. Morrison Old recommendations   Follow Up Instructions:   - Refer to allergy and follow up with Dr. Melvyn Novas as needed if symptoms do not resolve  I discussed the assessment and treatment plan with the patient. The patient was provided an opportunity to ask questions and all were answered. The patient agreed with the plan and demonstrated an understanding of the instructions.   The patient was advised to call back or seek an in-person evaluation if the symptoms worsen or if the condition fails to improve as anticipated.  I provided 22 minutes of non-face-to-face time during this encounter.   Martyn Ehrich, NP

## 2020-03-12 NOTE — Patient Instructions (Signed)
Recommendations:  Continue Protonix 20mg  daily, Pepcid 20mg  at bedtime and Singulair 10mg  at bedtime   Orders: - Put in an order for saline irrigation system with DME  Rx: - Promethazine with codeine 36ml every 6 hours for cough suppression. Taper off once you eliminate cough. No refills at this time.

## 2020-04-16 DIAGNOSIS — R232 Flushing: Secondary | ICD-10-CM | POA: Insufficient documentation

## 2020-05-03 ENCOUNTER — Ambulatory Visit: Payer: 59 | Admitting: Allergy

## 2020-05-03 ENCOUNTER — Encounter: Payer: Self-pay | Admitting: Allergy

## 2020-05-03 ENCOUNTER — Other Ambulatory Visit: Payer: Self-pay

## 2020-05-03 VITALS — BP 118/62 | HR 62 | Temp 98.2°F | Resp 18 | Ht 63.0 in | Wt 157.6 lb

## 2020-05-03 DIAGNOSIS — J3089 Other allergic rhinitis: Secondary | ICD-10-CM | POA: Diagnosis not present

## 2020-05-03 DIAGNOSIS — H1013 Acute atopic conjunctivitis, bilateral: Secondary | ICD-10-CM | POA: Diagnosis not present

## 2020-05-03 MED ORDER — OLOPATADINE HCL 0.2 % OP SOLN: 1.0000 [drp] | Freq: Every day | OPHTHALMIC | 2 refills | Status: AC | PRN

## 2020-05-03 MED ORDER — FLUTICASONE PROPIONATE 50 MCG/ACT NA SUSP
NASAL | 2 refills | Status: DC
Start: 2020-05-03 — End: 2021-01-13

## 2020-05-03 MED ORDER — AZELASTINE HCL 0.1 % NA SOLN: NASAL | 3 refills | Status: DC

## 2020-05-03 NOTE — Patient Instructions (Addendum)
-   environmental allergy skin testing is positive to grass pollen, weed pollen, mold and cat - allergen avoidance measures discussed/handouts provided - continue your current regimen with chlortabs 2-3 times a day.    - continue montelukast 10mg  daily - use Astelin 1-2 sprays each nostril 1-2 times a day for nasal drainage control - if having nasal congestion can use Flonase 2 sprays each nostril daily for 1-2 weeks at a time before stopping once symptoms improve - for itchy/watery/red eyes can use Pataday or Pataday Xtra Strength 1 drop each eye daily as needed -allergen immunotherapy discussed today including protocol, benefits and risk.  Informational handout provided.  If interested in this therapuetic option you can check with your insurance carrier for coverage.  Let us know if you would like to proceed with this option.   Follow-up 4 months or sooner if needed

## 2020-05-03 NOTE — Progress Notes (Signed)
New Patient Note  RE: Megan Melendez MRN: [id number] DOB: [birth year] Date of Office Visit: 05/03/2020  Referring provider: Martyn Ehrich, NP Primary care provider: Tracie Harrier, MD  Chief Complaint: allergies  History of present illness: Megan Melendez is a 54 y.o. female presenting today for consultation for allergies.  She states she keeps getting a cough around December every year that persist.  She follows with Dr. Melvyn Novas in pulmonology for upper airway cough syndrome.  She states Dr. Melvyn Novas has recommend combination of things that does help including Chlor-tab, Delsym, voice rest, hard candy.  She also takes a combination of Protonix and Pepcid.  She states she has had prednisone and tussionex which has not helped in the past.    She also reports having runny nose, grainy feeling in eyes, sneezing, throat clearing.  Has been on chlortabs for past year and takes 1 tab 2-3 times a day (does at least twice a day) and it does help.  She has tried other antihistamines like zyrtec, claritin, allegra without much benefit. She takes montelukast in AM for years.  Since she has not had chlor-tab or montelukast for past 3 days she states she is coughing more. She has been prescribed flonase before states it doesn't help.  She has been prescribed Astelin that she does 1 sprays each nostril daily; she feels this help nasal symptoms somewhat.  Has been taking Astelin for past 3 weeks.     She states she has had testing for serum IgE and skin testing about 8 years and 5 years ago respectively.  She states she was positive to "everything".  She has not undergone immunotherapy up to this point.  No history of eczema or food allergy.   Review of systems: Review of Systems  Constitutional: Negative.   HENT:       See HPI   Eyes:       See HPI,  Respiratory: Positive for cough. Negative for hemoptysis, sputum production, shortness of breath and wheezing.   Cardiovascular: Negative.     Gastrointestinal: Negative.   Musculoskeletal: Negative.   Skin: Negative.   Neurological: Negative.     All other systems negative unless noted above in HPI  Past medical history: Past Medical History:  Diagnosis Date  . Allergic rhinitis   . Migraine   . Recurrent upper respiratory infection (URI)     Past surgical history: Past Surgical History:  Procedure Laterality Date  . ABDOMINAL HYSTERECTOMY    . BACK SURGERY    . PARTIAL HYSTERECTOMY    . TONSILLECTOMY      Family history:  Family History  Problem Relation Age of Onset  . Allergic rhinitis Father   . Allergic rhinitis Brother   . Allergic rhinitis Paternal Uncle   . Breast cancer Neg Hx     Social history: She lives in a trailer with carpeting in the bedroom with electric heating and central cooling.  There are dogs and cats in the home.  There is no concern for water damage, mildew or roaches in the home.  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  She denies a smoking history.  Medication List: Current Outpatient Medications  Medication Sig Dispense Refill  . atorvastatin (LIPITOR) 20 MG tablet Take 1 tablet by mouth daily.    Marland Kitchen azelastine (ASTELIN) 0.1 % nasal spray Place 1 spray into both nostrils 2 (two) times daily. Use in each nostril as directed 30 mL  12  . chlorpheniramine (CHLOR-TRIMETON) 4 MG tablet Take 4 mg by mouth every 4 (four) hours as needed for allergies.    . famotidine (PEPCID) 20 MG tablet One after supper 30 tablet 11  . ibuprofen (ADVIL,MOTRIN) 800 MG tablet Take by mouth.    . levothyroxine (SYNTHROID, LEVOTHROID) 125 MCG tablet levothyroxine 125 mcg tablet  TK 1 T PO QD    . montelukast (SINGULAIR) 10 MG tablet Take 10 mg by mouth at bedtime.    . pantoprazole (PROTONIX) 20 MG tablet Take 20 mg by mouth daily.    . SUMAtriptan (IMITREX) 100 MG tablet Take 100 mg by mouth every 2 (two) hours as needed for migraine. May repeat in 2 hours if  headache persists or recurs.    . topiramate (TOPAMAX) 100 MG tablet Take 100 mg by mouth daily.    Marland Kitchen venlafaxine (EFFEXOR) 37.5 MG tablet Take 37.5 mg by mouth daily.    Marland Kitchen zolpidem (AMBIEN) 10 MG tablet Take 10 mg by mouth at bedtime as needed for sleep.     No current facility-administered medications for this visit.    Known medication allergies: Allergies  Allergen Reactions  . Penicillins Other (See Comments) and Rash  . Amoxicillin   . Tramadol      Physical examination: Blood pressure 118/62, pulse 62, temperature 98.2 F (36.8 C), temperature source Temporal, resp. rate 18, height 5\' 3"  (1.6 m), weight 157 lb 9.6 oz (71.5 kg), SpO2 98 %.  General: Alert, interactive, in no acute distress. HEENT: PERRLA, TMs pearly gray, turbinates minimally edematous with clear discharge, post-pharynx non erythematous. Neck: Supple without lymphadenopathy. Lungs: Clear to auscultation without wheezing, rhonchi or rales. {no increased work of breathing. CV: Normal S1, S2 without murmurs. Abdomen: Nondistended, nontender. Skin: Warm and dry, without lesions or rashes. Extremities:  No clubbing, cyanosis or edema. Neuro:   Grossly intact.  Diagnositics/Labs: Allergy testing: Environmental allergy skin prick testing is positive to [place], [place], perennial rye, sweet Vernal, cocklebur, giant ragweed, English plantain, sheep Sorrell. Intradermal testing is positive to mold mix 2 and cat hair. Allergy testing results were read and interpreted by provider, documented by clinical staff.   Assessment and plan: Allergic rhinitis with conjunctivitis  - environmental allergy skin testing is positive to grass pollen, weed pollen, mold and cat - allergen avoidance measures discussed/handouts provided - continue your current regimen with chlortabs 2-3 times a day.    - continue montelukast 10mg  daily - use Astelin 1-2 sprays each nostril 1-2 times a day for nasal drainage control - if having  nasal congestion can use Flonase 2 sprays each nostril daily for 1-2 weeks at a time before stopping once symptoms improve - for itchy/watery/red eyes can use Pataday or Pataday Xtra Strength 1 drop each eye daily as needed -allergen immunotherapy discussed today including protocol, benefits and risk.  Informational handout provided.  If interested in this therapuetic option you can check with your insurance carrier for coverage.  Let us know if you would like to proceed with this option.   Follow-up 4 months or sooner if needed   I appreciate the opportunity to take part in Megan Melendez's care. Please do not hesitate to contact me with questions.  Sincerely,   Prudy Feeler, MD Allergy/Immunology [facility]

## 2020-07-27 ENCOUNTER — Other Ambulatory Visit: Payer: Self-pay | Admitting: Internal Medicine

## 2020-09-02 ENCOUNTER — Other Ambulatory Visit: Payer: Self-pay | Admitting: Obstetrics and Gynecology

## 2020-09-02 DIAGNOSIS — Z1231 Encounter for screening mammogram for malignant neoplasm of breast: Secondary | ICD-10-CM

## 2020-09-13 ENCOUNTER — Ambulatory Visit: Payer: 59 | Admitting: Allergy

## 2020-10-09 ENCOUNTER — Other Ambulatory Visit: Payer: Self-pay

## 2020-10-09 ENCOUNTER — Ambulatory Visit
Admission: RE | Admit: 2020-10-09 | Discharge: 2020-10-09 | Disposition: A | Discharge status: Home / Self Care | Payer: 59 | Source: Ambulatory Visit | Attending: Obstetrics and Gynecology | Admitting: Obstetrics and Gynecology

## 2020-10-09 DIAGNOSIS — Z1231 Encounter for screening mammogram for malignant neoplasm of breast: Secondary | ICD-10-CM | POA: Insufficient documentation

## 2021-01-13 ENCOUNTER — Other Ambulatory Visit: Payer: Self-pay

## 2021-01-13 ENCOUNTER — Encounter: Payer: Self-pay | Admitting: Podiatry

## 2021-01-13 ENCOUNTER — Ambulatory Visit (INDEPENDENT_AMBULATORY_CARE_PROVIDER_SITE_OTHER): Payer: 59

## 2021-01-13 ENCOUNTER — Other Ambulatory Visit: Payer: Self-pay | Admitting: Podiatry

## 2021-01-13 ENCOUNTER — Ambulatory Visit: Payer: 59 | Admitting: Podiatry

## 2021-01-13 DIAGNOSIS — M779 Enthesopathy, unspecified: Secondary | ICD-10-CM

## 2021-01-13 DIAGNOSIS — G5782 Other specified mononeuropathies of left lower limb: Secondary | ICD-10-CM | POA: Diagnosis not present

## 2021-01-13 MED ORDER — TRIAMCINOLONE ACETONIDE 10 MG/ML IJ SUSP
5.0000 mg | Freq: Once | INTRAMUSCULAR | Status: AC
  Administered 2021-01-13: 5 mg

## 2021-01-13 MED ORDER — DEXAMETHASONE SODIUM PHOSPHATE 4 MG/ML IJ SOLN
2.0000 mg | Freq: Once | INTRAMUSCULAR | Status: AC
  Administered 2021-01-13: 2 mg

## 2021-01-13 NOTE — Progress Notes (Signed)
  Subjective:  Patient ID: Megan Melendez, female    DOB: 1966/01/20,  MRN: [id number]  Chief Complaint  Patient presents with  . Foot Pain    Patient presents today for left foot pain under her 5th sub metatarsal x months.  She says "it feels like a sock is bunched up underneath my toe"  She says it bothers her mostly with walking or driving and it aches    55 y.o. female presents with the above complaint. History confirmed with patient.   Objective:  Physical Exam: warm, good capillary refill, no trophic changes or ulcerative lesions, normal DP and PT pulses and normal sensory exam. Left Foot: Pain on palpation left fourth interspace plantarly with radiation into the toes, mild pain plantar fourth MTPJ  Radiographs: X-ray of the left foot: no fracture, dislocation, swelling or degenerative changes noted Assessment:   1. Interdigital neuroma of left foot      Plan:  Patient was evaluated and treated and all questions answered.  Discussed with her this is likely a interdigital neuroma, less likely a plantar plate injury or capsulitis.  Recommended injection today and if not improving by next visit consider injection into fourth MTPJ -Educated on etiology -Educated on padding and proper shoegear, I put a dancers pad into her shoe which should offload the area -XR reviewed with patient -Injection delivered to the affected interspaces  Procedure: Neuroma Injection Location: Left fourth interspace Skin Prep: Alcohol. Injectate: 0.5 cc 0.5% marcaine plain, 2 mg dexamethasone phosphate, 5 mg triamcinolone, 0.5 cc 2% lidocaine plain. Disposition: Patient tolerated procedure well. Injection site dressed with a band-aid.  Return in about 1 month (around 02/12/2021) for neuroma follow up .

## 2021-02-12 ENCOUNTER — Other Ambulatory Visit: Payer: Self-pay

## 2021-02-12 ENCOUNTER — Ambulatory Visit: Payer: 59 | Admitting: Podiatry

## 2021-02-12 DIAGNOSIS — G5782 Other specified mononeuropathies of left lower limb: Secondary | ICD-10-CM

## 2021-02-16 NOTE — Progress Notes (Signed)
  Subjective:  Patient ID: Megan Melendez, female    DOB: May 10, 1966,  MRN: [id number]  Chief Complaint  Patient presents with  . Neuroma    4 week follow up left foot    55 y.o. female presents with the above complaint. History confirmed with patient.   Objective:  Physical Exam: warm, good capillary refill, no trophic changes or ulcerative lesions, normal DP and PT pulses and normal sensory exam. Left Foot: Pain on palpation left fourth interspace plantarly with radiation into the toes, mild pain plantar fourth MTPJ  Radiographs: X-ray of the left foot: no fracture, dislocation, swelling or degenerative changes noted Assessment:   1. Interdigital neuroma of left foot      Plan:  Patient was evaluated and treated and all questions answered.  Still think is likely a interdigital neuroma given her clinical symptoms. -Repeat injection delivered to the affected interspaces  Procedure: Neuroma Injection Location: Left fourth interspace Skin Prep: Alcohol. Injectate: 0.5 cc 0.5% marcaine plain, 2 mg dexamethasone phosphate, 5 mg triamcinolone, 0.5 cc 2% lidocaine plain. Disposition: Patient tolerated procedure well. Injection site dressed with a band-aid.  Return in about 4 weeks (around 03/12/2021).

## 2021-03-05 ENCOUNTER — Encounter: Payer: Self-pay | Admitting: Podiatry

## 2021-03-05 ENCOUNTER — Other Ambulatory Visit: Payer: Self-pay

## 2021-03-05 ENCOUNTER — Ambulatory Visit: Payer: 59 | Admitting: Podiatry

## 2021-03-05 DIAGNOSIS — G5782 Other specified mononeuropathies of left lower limb: Secondary | ICD-10-CM

## 2021-03-05 NOTE — Progress Notes (Signed)
  Subjective:  Patient ID: Megan Melendez, female    DOB: [birth year],  MRN: [id number]  Chief Complaint  Patient presents with  . Neuroma    "its no better than last visit.  My foot bruised on the bottom after the injection and now it hurts under and around my little toe.  It feels like pressure and aches"    55 y.o. female returns for follow-up with the above complaint. History confirmed with patient.   Objective:  Physical Exam: warm, good capillary refill, no trophic changes or ulcerative lesions, normal DP and PT pulses and normal sensory exam. Left Foot: Pain on palpation left fourth interspace plantarly with radiation into the toes, mild pain plantar fourth MTPJ  Radiographs: X-ray of the left foot: no fracture, dislocation, swelling or degenerative changes noted Assessment:   1. Interdigital neuroma of left foot      Plan:  Patient was evaluated and treated and all questions answered.  This point she has had no relief with injection therapy x2.  Some advanced imaging would elucidate the exact etiology of her pain.  Recommended MRI of the left forefoot to evaluate for MRI as well as any joint pathology in the fourth or fifth metatarsal phalangeal joints.  We discussed further treatment of neuroma including excision which she would like to avoid.  No injection performed today as the last 2 have not helped.  Return after MRI for review  Return in about 4 weeks (around 04/02/2021) for after MRI to review.

## 2021-03-19 ENCOUNTER — Ambulatory Visit: Payer: 59 | Admitting: Podiatry

## 2021-03-21 ENCOUNTER — Other Ambulatory Visit: Payer: 59

## 2021-03-22 ENCOUNTER — Ambulatory Visit
Admission: RE | Admit: 2021-03-22 | Discharge: 2021-03-22 | Disposition: A | Discharge status: Home / Self Care | Payer: 59 | Source: Ambulatory Visit | Attending: Podiatry | Admitting: Podiatry

## 2021-03-22 ENCOUNTER — Other Ambulatory Visit: Payer: Self-pay

## 2021-03-22 DIAGNOSIS — G5782 Other specified mononeuropathies of left lower limb: Secondary | ICD-10-CM

## 2021-03-24 ENCOUNTER — Other Ambulatory Visit: Payer: Self-pay | Admitting: Internal Medicine

## 2021-03-25 LAB — SURGICAL PATHOLOGY

## 2021-04-02 ENCOUNTER — Other Ambulatory Visit: Payer: Self-pay

## 2021-04-02 ENCOUNTER — Encounter: Payer: Self-pay | Admitting: Podiatry

## 2021-04-02 ENCOUNTER — Ambulatory Visit: Payer: 59 | Admitting: Podiatry

## 2021-04-02 DIAGNOSIS — S99922A Unspecified injury of left foot, initial encounter: Secondary | ICD-10-CM

## 2021-04-05 ENCOUNTER — Encounter: Payer: Self-pay | Admitting: Podiatry

## 2021-04-05 NOTE — Progress Notes (Signed)
  Subjective:  Patient ID: Megan Melendez, female    DOB: 1966/01/27,  MRN: [id number]  No chief complaint on file.   55 y.o. female returns for follow-up with the above complaint. History confirmed with patient.  She completed the MRI  Objective:  Physical Exam: warm, good capillary refill, no trophic changes or ulcerative lesions, normal DP and PT pulses and normal sensory exam. Left Foot:  mild pain plantar fourth MTPJ  Radiographs: X-ray of the left foot: no fracture, dislocation, swelling or degenerative changes noted  Study Result  Narrative & Impression  CLINICAL DATA:  Pain on the plantar surface of the foot for approximately 4 months.   EXAM: MRI OF THE LEFT FOOT WITHOUT CONTRAST   TECHNIQUE: Multiplanar, multisequence MR imaging of the left foot was performed. No intravenous contrast was administered.   COMPARISON:  Plain films left foot 01/13/2021.   FINDINGS: Bones/Joint/Cartilage   Marrow signal is normal throughout without fracture, stress change or focal lesion. No erosion or osteophytosis is identified. No joint effusion.   Ligaments   The plantar plate of the fourth MTP joint is degenerated and almost completely torn from the base of the proximal phalanx of the fourth toe. Collateral ligaments about the MTP joints appear intact.   Muscles and Tendons   There is edema at the musculotendinous junction of the plantar interosseous muscles between the distal fourth and fifth metatarsals consistent with strain. The tendon appears intact. Otherwise negative.   Soft tissues   No mass is identified. Fluid is seen in the first intermetatarsal space.   IMPRESSION: Degenerated plantar plate of the fourth MTP joint with high-grade partial tear of the plate from the base of the proximal phalanx of the fourth toe. Negative for collateral ligament tear.   Edema at the musculotendinous junction of the plantar interosseous muscles between the distal fourth  and fifth metatarsals consistent with strain. The tendon appears intact.   Fluid in the first intermetatarsal space most compatible with bursitis.     Electronically Signed   By: Inge Rise M.D.   On: 03/24/2021 09:46   Assessment:   1. Injury of plantar plate of left foot, initial encounter      Plan:  Patient was evaluated and treated and all questions answered.  Reviewed the MRI findings with her in detail.  We discussed the nature of plantar plate injuries with chronic and acute.  Discussed both surgical and nonsurgical treatment.  She would like to opt for nonsurgical treatment with splinting at first.  Dispensed and applied a plantarflexed ring strap that she will wear for the next 6 weeks.  If she does not improve by then we will consider operative repair possible augmentation of the ligament and flexor tendon transfer if necessary.  Return in about 6 weeks (around 05/14/2021) for f/u plantar plate tear.

## 2021-05-14 ENCOUNTER — Encounter: Payer: Self-pay | Admitting: Podiatry

## 2021-05-14 ENCOUNTER — Other Ambulatory Visit: Payer: Self-pay

## 2021-05-14 ENCOUNTER — Ambulatory Visit: Payer: 59 | Admitting: Podiatry

## 2021-05-14 DIAGNOSIS — S99922A Unspecified injury of left foot, initial encounter: Secondary | ICD-10-CM

## 2021-05-19 NOTE — Progress Notes (Signed)
  Subjective:  Patient ID: Megan Melendez, female    DOB: 1966/06/06,  MRN: [id number]  Chief Complaint  Patient presents with   Plantar Fasciitis    6 week follow up left foot     55 y.o. female returns for follow-up with the above complaint. History confirmed with patient.  She has been wearing the splint has not helped much  Objective:  Physical Exam: warm, good capillary refill, no trophic changes or ulcerative lesions, normal DP and PT pulses and normal sensory exam. Left Foot:  mild pain plantar fourth MTPJ  Radiographs: X-ray of the left foot: no fracture, dislocation, swelling or degenerative changes noted  Study Result  Narrative & Impression  CLINICAL DATA:  Pain on the plantar surface of the foot for approximately 4 months.   EXAM: MRI OF THE LEFT FOOT WITHOUT CONTRAST   TECHNIQUE: Multiplanar, multisequence MR imaging of the left foot was performed. No intravenous contrast was administered.   COMPARISON:  Plain films left foot 01/13/2021.   FINDINGS: Bones/Joint/Cartilage   Marrow signal is normal throughout without fracture, stress change or focal lesion. No erosion or osteophytosis is identified. No joint effusion.   Ligaments   The plantar plate of the fourth MTP joint is degenerated and almost completely torn from the base of the proximal phalanx of the fourth toe. Collateral ligaments about the MTP joints appear intact.   Muscles and Tendons   There is edema at the musculotendinous junction of the plantar interosseous muscles between the distal fourth and fifth metatarsals consistent with strain. The tendon appears intact. Otherwise negative.   Soft tissues   No mass is identified. Fluid is seen in the first intermetatarsal space.   IMPRESSION: Degenerated plantar plate of the fourth MTP joint with high-grade partial tear of the plate from the base of the proximal phalanx of the fourth toe. Negative for collateral ligament tear.   Edema  at the musculotendinous junction of the plantar interosseous muscles between the distal fourth and fifth metatarsals consistent with strain. The tendon appears intact.   Fluid in the first intermetatarsal space most compatible with bursitis.     Electronically Signed   By: Inge Rise M.D.   On: 03/24/2021 09:46   Assessment:   1. Injury of plantar plate of left foot, initial encounter       Plan:  Patient was evaluated and treated and all questions answered.  Darco plantar flexion splint was not very helpful.  Recommended trying a Budin plantarflexion splint as well as a surgical shoe.  We will reevaluate at that point and if that has not helped at all then may need to look at surgery as an option.  Return in about 3 weeks (around 06/04/2021) for check plantar plate injury left fourth toe (no xrays).

## 2021-05-21 ENCOUNTER — Ambulatory Visit: Payer: 59 | Admitting: Podiatry

## 2021-05-21 ENCOUNTER — Other Ambulatory Visit: Payer: Self-pay

## 2021-05-21 ENCOUNTER — Ambulatory Visit (INDEPENDENT_AMBULATORY_CARE_PROVIDER_SITE_OTHER): Payer: 59

## 2021-05-21 DIAGNOSIS — S99922D Unspecified injury of left foot, subsequent encounter: Secondary | ICD-10-CM

## 2021-05-21 DIAGNOSIS — M21622 Bunionette of left foot: Secondary | ICD-10-CM | POA: Diagnosis not present

## 2021-05-26 ENCOUNTER — Telehealth: Payer: Self-pay | Admitting: Urology

## 2021-05-26 NOTE — Telephone Encounter (Signed)
DOS - 05/30/21  METATARSAL OSTEOTOM 4TH AND 5TH LEFT --- 28308 HAMMERTOE REPAIR 4TH LEFT --- [id number] OPEN TREATMENT MPJ DISLOCATION 4TH LEFT --- [id number]  [facility] EFFECTIVE DATE - 09/28/20  PLAN DEDUCTIBLE - $1,000.00 W/ [id number] REMAINING OUT OF POCKET - $2,500.00 W/ [id number] REMAINING COINSURANCE - 10% COPAY - $0.00   RECEIVED FAX FROM [facility] STATING THAT CPT CODES 60454 X'S 2. 28285 AND  09811 HAVE BEEN APPROVED, AUTH # [id number], GOOD FROM 05/30/21 - 08/28/21.

## 2021-05-26 NOTE — Progress Notes (Signed)
Subjective:  Patient ID: Megan Melendez, female    DOB: 1966/01/16,  MRN: [id number]  Chief Complaint  Patient presents with   Foot Injury     swelling left foot     55 y.o. female returns for follow-up with the above complaint. History confirmed with patient.  Budin splint surgical shoe was not helpful and she is having more pain now along the fourth metatarsal  Objective:  Physical Exam: warm, good capillary refill, no trophic changes or ulcerative lesions, normal DP and PT pulses and normal sensory exam. Left Foot:  mild pain plantar fourth MTPJ, this worsens with dorsal drawer test.  Painful tailor's bunion  Radiographs: X-ray of the left foot: New films taken today show no evidence of stress fracture of the fourth metatarsal  Study Result  Narrative & Impression  CLINICAL DATA:  Pain on the plantar surface of the foot for approximately 4 months.   EXAM: MRI OF THE LEFT FOOT WITHOUT CONTRAST   TECHNIQUE: Multiplanar, multisequence MR imaging of the left foot was performed. No intravenous contrast was administered.   COMPARISON:  Plain films left foot 01/13/2021.   FINDINGS: Bones/Joint/Cartilage   Marrow signal is normal throughout without fracture, stress change or focal lesion. No erosion or osteophytosis is identified. No joint effusion.   Ligaments   The plantar plate of the fourth MTP joint is degenerated and almost completely torn from the base of the proximal phalanx of the fourth toe. Collateral ligaments about the MTP joints appear intact.   Muscles and Tendons   There is edema at the musculotendinous junction of the plantar interosseous muscles between the distal fourth and fifth metatarsals consistent with strain. The tendon appears intact. Otherwise negative.   Soft tissues   No mass is identified. Fluid is seen in the first intermetatarsal space.   IMPRESSION: Degenerated plantar plate of the fourth MTP joint with high-grade partial tear  of the plate from the base of the proximal phalanx of the fourth toe. Negative for collateral ligament tear.   Edema at the musculotendinous junction of the plantar interosseous muscles between the distal fourth and fifth metatarsals consistent with strain. The tendon appears intact.   Fluid in the first intermetatarsal space most compatible with bursitis.     Electronically Signed   By: Inge Rise M.D.   On: 03/24/2021 09:46   Assessment:   1. Injury of plantar plate of left foot, subsequent encounter       Plan:  Patient was evaluated and treated and all questions answered.  Reviewed her progress thus far and nonsurgical treatment has not helped very much at all.  I recommend proceed with surgical repair of the plantar plate.  She also would like her tailor's bunion corrected at the same time as this often causes her pain.  Surgical plan will be as follows: Tailor's bunionectomy through minimally invasive approach and plantar plate repair with possible metatarsal osteotomy.  All questions were addressed and we discussed the risk benefits and potential complications including but not limited to pain, swelling, infection, scar, numbness which may be temporary or permanent, chronic pain, stiffness, nerve pain or damage, wound healing problems, bone healing problems including delayed or non-union.  She understands and wishes to proceed.  Informed consent was signed and reviewed.  Surgery scheduled for next Friday.   Surgical plan:  Procedure: -Left foot tailor's bunion correction plantar plate repair possible metatarsal osteotomy the fourth metatarsal  Location: -[facility]  Anesthesia plan: -MAC with local  Postoperative  pain plan: - Tylenol 1000 mg every 6 hours, ibuprofen 600 mg every 6 hours, gabapentin 300 mg every 8 hours x5 days, oxycodone 5 mg 1-2 tabs every 6 hours only as needed  DVT prophylaxis: -None required  WB Restrictions / DME needs: -WBAT in CAM boot  which was dispensed    No follow-ups on file.

## 2021-05-30 ENCOUNTER — Encounter: Payer: Self-pay | Admitting: Podiatry

## 2021-05-30 ENCOUNTER — Other Ambulatory Visit: Payer: Self-pay | Admitting: Podiatry

## 2021-05-30 DIAGNOSIS — M2042 Other hammer toe(s) (acquired), left foot: Secondary | ICD-10-CM | POA: Diagnosis not present

## 2021-05-30 DIAGNOSIS — S99922A Unspecified injury of left foot, initial encounter: Secondary | ICD-10-CM | POA: Diagnosis not present

## 2021-05-30 DIAGNOSIS — M2012 Hallux valgus (acquired), left foot: Secondary | ICD-10-CM | POA: Diagnosis not present

## 2021-05-30 MED ORDER — HYDROCODONE-ACETAMINOPHEN 5-325 MG PO TABS: ORAL_TABLET | ORAL | 0 refills | Status: DC

## 2021-05-30 NOTE — Progress Notes (Signed)
05/30/21 L tailor's bunionectomy and 4th hammertoe, plantar plate repair

## 2021-05-30 NOTE — Addendum Note (Signed)
Addended bySherryle Lis, Janyiah Silveri R on: 05/30/2021 10:50 AM   Modules accepted: Orders

## 2021-06-04 ENCOUNTER — Other Ambulatory Visit: Payer: Self-pay

## 2021-06-04 ENCOUNTER — Ambulatory Visit: Payer: 59 | Admitting: Podiatry

## 2021-06-04 DIAGNOSIS — Z9889 Other specified postprocedural states: Secondary | ICD-10-CM

## 2021-06-04 DIAGNOSIS — S99922D Unspecified injury of left foot, subsequent encounter: Secondary | ICD-10-CM

## 2021-06-04 DIAGNOSIS — M21622 Bunionette of left foot: Secondary | ICD-10-CM

## 2021-06-04 MED ORDER — DOXYCYCLINE HYCLATE 100 MG PO TABS: 100.0000 mg | ORAL_TABLET | Freq: Two times a day (BID) | ORAL | 0 refills | Status: AC

## 2021-06-07 NOTE — Progress Notes (Signed)
  Subjective:  Patient ID: Megan Melendez, female    DOB: 1966/07/08,  MRN: [id number]  Chief Complaint  Patient presents with   Routine Post Op    follow up 3 weeks for check plantar plate injury left fourth toe (no xrays).POV #1 TAILORS BUNION CORECTION LT FOOT, REPAIR OF LIGAMENT TEAR 4TH TOE, POSSIBLE HAMMERTOE CORRECTION AND/OR METATARSAL BONE CUT      55 y.o. female returns for post-op check.  She is doing well she not having much pain  Review of Systems: Negative except as noted in the HPI. Denies N/V/F/Ch.   Objective:  There were no vitals filed for this visit. There is no height or weight on file to calculate BMI. Constitutional Well developed. Well nourished.  Vascular Foot warm and well perfused. Capillary refill normal to all digits.   Neurologic Normal speech. Oriented to person, place, and time. Epicritic sensation to light touch grossly present bilaterally.  Dermatologic Skin healing well without signs of infection. Skin edges well coapted.  There is some mild periwound erythema  Orthopedic: Tenderness to palpation noted about the surgical site.    Assessment:   1. Injury of plantar plate of left foot, subsequent encounter   2. Tailor's bunion of left foot   3. Post-operative state    Plan:  Patient was evaluated and treated and all questions answered.  S/p foot surgery left -Progressing as expected post-operatively. -XR: New x-rays at next visit -WB Status: WBAT in CAM boot -Sutures: Removed at next visit. -Medications: Doxycycline as a precaution for the erythema doubt infection but want to place her on this as a precaution -Foot redressed.  Return in about 2 weeks (around 06/18/2021) for post op (new x-rays), suture removal.

## 2021-06-10 DIAGNOSIS — M79676 Pain in unspecified toe(s): Secondary | ICD-10-CM

## 2021-06-18 ENCOUNTER — Other Ambulatory Visit: Payer: Self-pay

## 2021-06-18 ENCOUNTER — Encounter: Payer: Self-pay | Admitting: Podiatry

## 2021-06-18 ENCOUNTER — Ambulatory Visit (INDEPENDENT_AMBULATORY_CARE_PROVIDER_SITE_OTHER): Payer: 59

## 2021-06-18 ENCOUNTER — Ambulatory Visit (INDEPENDENT_AMBULATORY_CARE_PROVIDER_SITE_OTHER): Payer: 59 | Admitting: Podiatry

## 2021-06-18 DIAGNOSIS — Z9889 Other specified postprocedural states: Secondary | ICD-10-CM

## 2021-06-18 DIAGNOSIS — S99922D Unspecified injury of left foot, subsequent encounter: Secondary | ICD-10-CM

## 2021-06-18 MED ORDER — HYDROCODONE-ACETAMINOPHEN 5-325 MG PO TABS: 1.0000 | ORAL_TABLET | Freq: Four times a day (QID) | ORAL | 0 refills | Status: DC | PRN

## 2021-06-19 NOTE — Progress Notes (Signed)
  Subjective:  Patient ID: Megan Melendez, female    DOB: [birth year],  MRN: [id number]  Chief Complaint  Patient presents with   Routine Post Op    POV #2 DOS 05/30/2021 TAILORS BUNION CORECTION LT FOOT, REPAIR OF LIGAMENT TEAR 4TH TOE, POSSIBLE HAMMERTOE CORRECTION AND/OR METATARSAL BONE CUT   "the pain is worse than before"      55 y.o. female returns for post-op check.  She is having more pain and swelling in the ball of the foot that she did last time, she has completed the antibiotics  Review of Systems: Negative except as noted in the HPI. Denies N/V/F/Ch.   Objective:  There were no vitals filed for this visit. There is no height or weight on file to calculate BMI. Constitutional Well developed. Well nourished.  Vascular Foot warm and well perfused. Capillary refill normal to all digits.   Neurologic Normal speech. Oriented to person, place, and time. Epicritic sensation to light touch grossly present bilaterally.  Dermatologic Skin healing well without signs of infection. Skin edges well coapted.  Erythema has resolved  Orthopedic: Tenderness to palpation noted about the surgical site.   X-rays of left foot show good correction of tailor's bunion with osteotomy as well as intact pin across the fourth MTPJ Assessment:   1. Post-operative state   2. Injury of plantar plate of left foot, subsequent encounter    Plan:  Patient was evaluated and treated and all questions answered.  S/p foot surgery left -Progressing as expected post-operatively. -XR: New x-rays at next visit -WB Status: WBAT in CAM boot -Sutures: Removed today -Medications: I did represcribe her some oral pain medication she has had increased pain, hopeful this will improve once we remove the pin next visit -Foot redressed.  Return in about 3 weeks (around 07/09/2021) for post op (new x-rays); remove pin .

## 2021-07-02 ENCOUNTER — Encounter: Payer: Self-pay | Admitting: Podiatry

## 2021-07-02 ENCOUNTER — Telehealth: Payer: Self-pay | Admitting: Podiatry

## 2021-07-09 ENCOUNTER — Ambulatory Visit (INDEPENDENT_AMBULATORY_CARE_PROVIDER_SITE_OTHER): Payer: 59 | Admitting: Podiatry

## 2021-07-09 ENCOUNTER — Other Ambulatory Visit: Payer: Self-pay

## 2021-07-09 ENCOUNTER — Ambulatory Visit (INDEPENDENT_AMBULATORY_CARE_PROVIDER_SITE_OTHER): Payer: 59

## 2021-07-09 DIAGNOSIS — S99922D Unspecified injury of left foot, subsequent encounter: Secondary | ICD-10-CM

## 2021-07-09 DIAGNOSIS — M21622 Bunionette of left foot: Secondary | ICD-10-CM

## 2021-07-10 NOTE — Progress Notes (Signed)
  Subjective:  Patient ID: Megan Melendez, female    DOB: 08-Apr-1966,  MRN: [id number]  Chief Complaint  Patient presents with   Routine Post Op    POV #3 DOS 05/30/2021 TAILORS BUNION CORECTION LT FOOT, REPAIR OF LIGAMENT TEAR 4TH TOE, POSSIBLE HAMMERTOE CORRECTION AND/OR METATARSAL BONE CUT      55 y.o. female returns for post-op check.  Continues to have pain but is doing well  Review of Systems: Negative except as noted in the HPI. Denies N/V/F/Ch.   Objective:  There were no vitals filed for this visit. There is no height or weight on file to calculate BMI. Constitutional Well developed. Well nourished.  Vascular Foot warm and well perfused. Capillary refill normal to all digits.   Neurologic Normal speech. Oriented to person, place, and time. Epicritic sensation to light touch grossly present bilaterally.  Dermatologic Skin healing well without signs of infection. Skin edges well coapted.  Erythema has resolved  Orthopedic: Tenderness to palpation noted about the surgical site.   New multiple view x-rays of left foot show good correction of tailor's bunion with osteotomy as well as intact pin across the fourth MTPJ Assessment:   1. Tailor's bunion of left foot   2. Injury of plantar plate of left foot, subsequent encounter    Plan:  Patient was evaluated and treated and all questions answered.  S/p foot surgery left -Pin removed uneventfully.  She may transition out of the boot back to regular shoe gear.  Advised to avoid extreme dorsiflexion positions of the toe going up in the toes or impact exercise now.  Gentle walking okay.  She can go back to work.  Begin gentle active and passive range of motion of the toes as well.  May consider physical therapy if still stiff.  Return in about 6 weeks (around 08/20/2021) for post op (new x-rays).

## 2021-07-21 ENCOUNTER — Telehealth: Payer: Self-pay | Admitting: *Deleted

## 2021-07-21 MED ORDER — IBUPROFEN 800 MG PO TABS: 800.0000 mg | ORAL_TABLET | Freq: Three times a day (TID) | ORAL | 0 refills | Status: DC | PRN

## 2021-07-21 NOTE — Telephone Encounter (Signed)
"  I'm a patient of Dr. Sherryle Lis.  I was calling to see if he could call me in some Ibuprofen for my foot.  Please give me a call back or call the prescription in."

## 2021-07-21 NOTE — Addendum Note (Signed)
Addended bySherryle Lis, Marieme Mcmackin R on: 07/21/2021 05:12 PM   Modules accepted: Orders

## 2021-08-20 ENCOUNTER — Other Ambulatory Visit: Payer: Self-pay

## 2021-08-20 ENCOUNTER — Ambulatory Visit (INDEPENDENT_AMBULATORY_CARE_PROVIDER_SITE_OTHER): Payer: 59

## 2021-08-20 ENCOUNTER — Ambulatory Visit (INDEPENDENT_AMBULATORY_CARE_PROVIDER_SITE_OTHER): Payer: 59 | Admitting: Podiatry

## 2021-08-20 DIAGNOSIS — S99922D Unspecified injury of left foot, subsequent encounter: Secondary | ICD-10-CM

## 2021-08-20 DIAGNOSIS — M21622 Bunionette of left foot: Secondary | ICD-10-CM | POA: Diagnosis not present

## 2021-08-23 NOTE — Progress Notes (Signed)
  Subjective:  Patient ID: Megan Melendez, female    DOB: 1966/05/25,  MRN: [id number]  Chief Complaint  Patient presents with   Routine Post Op     (xray)POV #3 DOS 05/30/2021 TAILORS BUNION CORECTION LT FOOT, REPAIR OF LIGAMENT TEAR 4TH TOE, POSSIBLE HAMMERTOE CORRECTION AND/OR METATARSAL BONE CUT      55 y.o. female returns for post-op check.  Much improved, no pain but toe feels stiff  Review of Systems: Negative except as noted in the HPI. Denies N/V/F/Ch.   Objective:  There were no vitals filed for this visit. There is no height or weight on file to calculate BMI. Constitutional Well developed. Well nourished.  Vascular Foot warm and well perfused. Capillary refill normal to all digits.   Neurologic Normal speech. Oriented to person, place, and time. Epicritic sensation to light touch grossly present bilaterally.  Dermatologic Skin healing well without signs of infection. Skin edges well coapted.  Erythema has resolved  Orthopedic: Limited Rom of 4th MTPJ   New multiple view x-rays of left foot show increasing bone formation about osteotomy site Assessment:   1. Tailor's bunion of left foot   2. Injury of plantar plate of left foot, subsequent encounter    Plan:  Patient was evaluated and treated and all questions answered.  S/p foot surgery left -Overall doing well, toe is stiff and we reviewed manual and active ROM of the toe to improve this, but would prefer some stiffness over instability compared to recurrence of deformity. Return as needed  Return if symptoms worsen or fail to improve.

## 2021-10-01 ENCOUNTER — Ambulatory Visit: Payer: 59 | Admitting: Podiatry

## 2021-11-20 ENCOUNTER — Other Ambulatory Visit: Payer: Self-pay | Admitting: Obstetrics and Gynecology

## 2021-11-20 DIAGNOSIS — Z1231 Encounter for screening mammogram for malignant neoplasm of breast: Secondary | ICD-10-CM

## 2021-12-29 ENCOUNTER — Ambulatory Visit
Admission: RE | Admit: 2021-12-29 | Discharge: 2021-12-29 | Disposition: A | Discharge status: Home / Self Care | Payer: 59 | Source: Ambulatory Visit | Attending: Obstetrics and Gynecology | Admitting: Obstetrics and Gynecology

## 2021-12-29 DIAGNOSIS — Z1231 Encounter for screening mammogram for malignant neoplasm of breast: Secondary | ICD-10-CM | POA: Diagnosis present

## 2022-04-20 ENCOUNTER — Encounter: Payer: Self-pay | Admitting: Podiatry

## 2022-04-20 ENCOUNTER — Ambulatory Visit: Payer: 59 | Admitting: Podiatry

## 2022-04-20 ENCOUNTER — Ambulatory Visit (INDEPENDENT_AMBULATORY_CARE_PROVIDER_SITE_OTHER): Payer: 59

## 2022-04-20 DIAGNOSIS — S99922D Unspecified injury of left foot, subsequent encounter: Secondary | ICD-10-CM | POA: Diagnosis not present

## 2022-04-20 DIAGNOSIS — S99922S Unspecified injury of left foot, sequela: Secondary | ICD-10-CM | POA: Diagnosis not present

## 2022-04-20 DIAGNOSIS — M79675 Pain in left toe(s): Secondary | ICD-10-CM

## 2022-04-20 DIAGNOSIS — M87076 Idiopathic aseptic necrosis of unspecified foot: Secondary | ICD-10-CM

## 2022-04-20 NOTE — Patient Instructions (Signed)
Call [facility] to schedule your MRI at the below locations.  Please allow at least 1 business day after your visit to process the referral.  It may take longer depending on approval from insurance.  Please let me know if you have issues or problems scheduling the MRI   [facility] [place] [phone] [address] ,  [address]    

## 2022-04-20 NOTE — Progress Notes (Signed)
  Subjective:  Patient ID: Megan Melendez, female    DOB: [birth year],  MRN: [id number]  Chief Complaint  Patient presents with   Foot Pain    "This foot hurts more now then before I had the surgery."    56 y.o. female presents with the above complaint. History confirmed with patient.  She says it was doing well for some time and has slowly started to worsen again.  The pain is quite severe if she pushes the fourth toe up.  Most of the pain is in the bottom of the joint.  Objective:  Physical Exam: warm, good capillary refill, no trophic changes or ulcerative lesions, normal DP and PT pulses, normal sensory exam, and tenderness on the plantar fourth metatarsal head, plantar fourth MTPJ, no gross instability, the toe is stiff pain with range of motion, no pain on the fifth metatarsal.   Radiographs: Multiple views x-ray of the left foot: Significant osteoarthrosis of the fourth MTPJ, there is sclerosis of the proximal phalanx and metatarsal head Assessment:   1. Injury of plantar plate of left foot, sequela   2. Avascular necrosis of metatarsal bone ([place])      Plan:  Patient was evaluated and treated and all questions answered.  I reviewed my clinical and radiographic findings with her.  Discussed with her that there is osteoarthrosis of the metatarsal phalangeal joint has progressed since surgery.  There is significant sclerosis of the metatarsal head and proximal phalanx, my chief concern would be avascular necrosis.  We discussed further treatment including injection therapy rigid orthotics metatarsal head resection or implant arthroplasty.  I discussed with her prior to proceeding with any further interventions that I would recommend an MRI to evaluate for this.  MRI has been ordered and I will see her back after this to reevaluate  Return for after MRI to review.

## 2022-04-27 ENCOUNTER — Ambulatory Visit
Admission: RE | Admit: 2022-04-27 | Discharge: 2022-04-27 | Disposition: A | Discharge status: Home / Self Care | Payer: 59 | Source: Ambulatory Visit | Attending: Podiatry | Admitting: Podiatry

## 2022-04-27 ENCOUNTER — Telehealth: Payer: Self-pay | Admitting: *Deleted

## 2022-04-27 DIAGNOSIS — S99922S Unspecified injury of left foot, sequela: Secondary | ICD-10-CM

## 2022-04-27 DIAGNOSIS — M87076 Idiopathic aseptic necrosis of unspecified foot: Secondary | ICD-10-CM

## 2022-04-27 NOTE — Telephone Encounter (Signed)
Requesting pain meds

## 2022-04-28 MED ORDER — HYDROCODONE-ACETAMINOPHEN 5-325 MG PO TABS: 1.0000 | ORAL_TABLET | Freq: Four times a day (QID) | ORAL | 0 refills | Status: AC | PRN

## 2022-04-28 NOTE — Telephone Encounter (Signed)
Patient called to follow to see if Dr. Sherryle Lis would be able to send in pain medication. She is in severe pain after her MRI and she is back in her boot and on crutches

## 2022-04-28 NOTE — Addendum Note (Signed)
Addended bySherryle Lis, Leila Schuff R on: 04/28/2022 08:46 AM   Modules accepted: Orders

## 2022-04-30 NOTE — Telephone Encounter (Signed)
Patients mom came to pick up paperwork.

## 2022-05-01 LAB — CBC WITH DIFFERENTIAL/PLATELET
Basophils Absolute: 0 10*3/uL (ref 0.0–0.2)
Basos: 0 %
EOS (ABSOLUTE): 0.1 10*3/uL (ref 0.0–0.4)
Eos: 2 %
Hematocrit: 39.7 % (ref 34.0–46.6)
Hemoglobin: 13.4 g/dL (ref 11.1–15.9)
Immature Grans (Abs): 0 10*3/uL (ref 0.0–0.1)
Immature Granulocytes: 0 %
Lymphocytes Absolute: 2 10*3/uL (ref 0.7–3.1)
Lymphs: 27 %
MCH: 29.8 pg (ref 26.6–33.0)
MCHC: 33.8 g/dL (ref 31.5–35.7)
MCV: 88 fL (ref 79–97)
Monocytes Absolute: 0.7 10*3/uL (ref 0.1–0.9)
Monocytes: 9 %
Neutrophils Absolute: 4.8 10*3/uL (ref 1.4–7.0)
Neutrophils: 62 %
Platelets: 359 10*3/uL (ref 150–450)
RBC: 4.5 x10E6/uL (ref 3.77–5.28)
RDW: 12.6 % (ref 11.7–15.4)
WBC: 7.7 10*3/uL (ref 3.4–10.8)

## 2022-05-01 LAB — SEDIMENTATION RATE: Sed Rate: 17 mm/hr (ref 0–40)

## 2022-05-01 LAB — C-REACTIVE PROTEIN: CRP: 7 mg/L (ref 0–10)

## 2022-05-04 ENCOUNTER — Ambulatory Visit: Payer: 59 | Admitting: Podiatry

## 2022-05-04 ENCOUNTER — Encounter: Payer: Self-pay | Admitting: Podiatry

## 2022-05-04 DIAGNOSIS — M19072 Primary osteoarthritis, left ankle and foot: Secondary | ICD-10-CM

## 2022-05-04 DIAGNOSIS — M19079 Primary osteoarthritis, unspecified ankle and foot: Secondary | ICD-10-CM

## 2022-05-04 NOTE — Progress Notes (Signed)
Subjective:  Patient ID: Megan Melendez, female    DOB: 09-26-1966,  MRN: [id number]  Chief Complaint  Patient presents with   Foot Pain    MRI result follow-up. Patient reports still having pain in the left foot.     56 y.o. female presents with the above complaint. History confirmed with patient.  She completed the MRI still quite painful for her  Objective:  Physical Exam: warm, good capillary refill, no trophic changes or ulcerative lesions, normal DP and PT pulses, normal sensory exam, and tenderness on the plantar fourth metatarsal head, plantar fourth MTPJ, no gross instability, the toe is stiff pain with range of motion, no pain on the fifth metatarsal.   Radiographs: Multiple views x-ray of the left foot: Significant osteoarthrosis of the fourth MTPJ, there is sclerosis of the proximal phalanx and metatarsal head  CRP ESR and CBC within normal limits  Study Result  Narrative & Impression  CLINICAL DATA:  Pain involving the fourth and fifth toes.   EXAM: MRI OF THE LEFT TOES WITHOUT CONTRAST   TECHNIQUE: Multiplanar, multisequence MR imaging of the left foot was performed. No intravenous contrast was administered.   COMPARISON:  MRI 03/22/2021 and multiple previous radiographs.   FINDINGS: Severe/advanced arthropathic changes involving the fourth MTP joint. There is abnormal T1 and T2 signal intensity and cystic type degenerative changes involving the fourth metatarsal head and the fourth proximal phalanx. Some surrounding inflammatory changes are also noted but no findings for acute septic arthritis. No findings for cellulitis or myofasciitis. Prior radiographs show a long percutaneous pin involving the fourth toe. Findings could be due to severe postoperative degenerative changes, possibly denervation related or sequela of septic arthritis. Avascular necrosis would be unlikely on both sides of the joint but is possible.   There is a healed osteotomy involving  the distal fifth metatarsal shaft.   The major tendons and ligaments appear intact. The plantar plate appears healed/repaired.   IMPRESSION: 1. Severe/advanced postoperative arthropathic changes involving the fourth MTP joint. This could be due to severe postoperative arthritis, possibly denervation related or sequela of septic arthritis. Avascular necrosis would be unlikely on both sides of the joint but is possible. 2. No findings acute septic arthritis, cellulitis myositis. 3. Healed fourth metatarsal osteotomy.     Electronically Signed   By: Marijo Sanes M.D.   On: 04/28/2022 10:36   Assessment:   1. Arthritis of metatarsophalangeal joint       Plan:  Patient was evaluated and treated and all questions answered.  We reviewed the results of her MRI.  I discussed with her that I do think this is secondary osteoarthrosis.  Avascular necrosis is not likely and I agree with radiology interpretation of this.  There were no evidence of infection clinically her lab results are within normal limits I do not think this is septic arthritis.  We discussed further treatment I expect likely she will need surgical intervention for this.  I recommend implant arthroplasty for the arthritic surfaces.  We discussed the risk benefits and potential complications of this.  She wishes to proceed with surgery.  Informed consent was signed and reviewed.  In the interim I recommended corticosteroid injection to alleviate pain and inflammation at the site.  Following sterile prep with Betadine and a local field block, it was injected with 10 mg of Kenalog and 2 mg of dexamethasone.  She tolerated this well.   Surgical plan:  Procedure: -Left fourth metatarsal phalangeal joint implant arthroplasty  Location: -[facility]  Anesthesia plan: -IV sedation with local field block  Postoperative pain plan: - Tylenol 1000 mg every 6 hours, ibuprofen 600 mg every 6 hours, gabapentin 300 mg every 8 hours x5  days, oxycodone 5 mg 1-2 tabs every 6 hours only as needed  DVT prophylaxis: -None required  WB Restrictions / DME needs: -WBAT in CAM boot which she has  Return for after surgery.

## 2022-05-21 ENCOUNTER — Telehealth: Payer: Self-pay | Admitting: Urology

## 2022-05-21 NOTE — Telephone Encounter (Signed)
DOS - 06/19/22  ARTHROPLASTY LESSER MPJ 4TH LEFT --- 16606  [employer] EFFECTIVE DATE - 09/28/21  PLAN DEDUCTIBLE - $1,750.00 W/  $1,180.86 REMAINING OUT OF POCKET - $6,000.00 W/ $4,766.60 REMAINING COINSURANCE - 20% COPAY - $0.00  PER [employer] WEBSITE FOR CPT CODE 00459 Notification or Prior Authorization is not required for the requested services  This [employer] plan does not currently require a prior authorization for these services. If you have general questions about the prior authorization requirements, please call us at [phone] or visit [email or web address] > Clinician Resources > Advance and Admission Notification Requirements. The number above acknowledges your notification. Please write this number down for future reference. Notification is not a guarantee of coverage or payment.  Decision ID #:[id number]

## 2022-06-04 ENCOUNTER — Telehealth: Payer: Self-pay | Admitting: *Deleted

## 2022-06-04 MED ORDER — IBUPROFEN 800 MG PO TABS: 800.0000 mg | ORAL_TABLET | Freq: Three times a day (TID) | ORAL | 0 refills | Status: AC | PRN

## 2022-06-04 NOTE — Telephone Encounter (Signed)
Patient is calling to request ibuprofen refill-800 mg, foot pain,nothing stronger, please advise.

## 2022-06-05 ENCOUNTER — Ambulatory Visit: Payer: 59 | Admitting: Podiatry

## 2022-06-05 ENCOUNTER — Telehealth: Payer: Self-pay | Admitting: *Deleted

## 2022-06-05 ENCOUNTER — Ambulatory Visit (INDEPENDENT_AMBULATORY_CARE_PROVIDER_SITE_OTHER): Payer: 59

## 2022-06-05 DIAGNOSIS — M19072 Primary osteoarthritis, left ankle and foot: Secondary | ICD-10-CM | POA: Diagnosis not present

## 2022-06-05 DIAGNOSIS — M19079 Primary osteoarthritis, unspecified ankle and foot: Secondary | ICD-10-CM

## 2022-06-05 MED ORDER — GABAPENTIN 100 MG PO CAPS: 100.0000 mg | ORAL_CAPSULE | Freq: Three times a day (TID) | ORAL | 3 refills | Status: AC

## 2022-06-05 NOTE — Telephone Encounter (Signed)
Patient notified

## 2022-06-05 NOTE — Progress Notes (Signed)
Subjective:  Patient ID: Megan Melendez, female    DOB: [birth year],  MRN: [id number]  No chief complaint on file.   56 y.o. female presents with the above complaint.  Patient presents with complaint of having a misstep to the left foot.  Patient states that she is having surgery done by Dr. Lynnell Catalan.  She states started become very painful and has been hurting a lot.  She has not seen anyone else prior to seeing me she wanted to get it evaluated.  She wants to make sure nothing is broken.   Review of Systems: Negative except as noted in the HPI. Denies N/V/F/Ch.  Past Medical History:  Diagnosis Date   Allergic rhinitis    Migraine    Recurrent upper respiratory infection (URI)     Current Outpatient Medications:    gabapentin (NEURONTIN) 100 MG capsule, Take 1 capsule (100 mg total) by mouth 3 (three) times daily., Disp: 90 capsule, Rfl: 3   atorvastatin (LIPITOR) 20 MG tablet, Take 1 tablet by mouth daily., Disp: , Rfl:    azelastine (ASTELIN) 0.1 % nasal spray, Place 1 spray into both nostrils 2 (two) times daily. Use in each nostril as directed, Disp: 30 mL, Rfl: 12   brompheniramine-pseudoephedrine-DM 30-2-10 MG/5ML syrup, brompheniramine-pseudoephedrine-DM 2 mg-30 mg-10 mg/5 mL oral syrup  TAKE 5 ML BY MOUTH FOUR TIMES DAILY FOR UP TO 10 DAYS AS NEEDED FOR ALLERGIES, Disp: , Rfl:    estradiol (CLIMARA - DOSED IN MG/24 HR) 0.05 mg/24hr patch, estradiol 0.05 mg/24 hr weekly transdermal patch  APPLY 1 PATCH TOPICALLY TO THE SKIN 1 TIME A WEEK, Disp: , Rfl:    famotidine (PEPCID) 20 MG tablet, TAKE 1 TABLET BY MOUTH AFTER SUPPER, Disp: 30 tablet, Rfl: 2   ibuprofen (ADVIL) 800 MG tablet, Take 1 tablet (800 mg total) by mouth every 8 (eight) hours as needed., Disp: 30 tablet, Rfl: 0   levothyroxine (SYNTHROID, LEVOTHROID) 125 MCG tablet, levothyroxine 125 mcg tablet  TK 1 T PO QD, Disp: , Rfl:    montelukast (SINGULAIR) 10 MG tablet, Take 10 mg by mouth at bedtime., Disp: , Rfl:     Olopatadine HCl (PATADAY) 0.2 % SOLN, Place 1 drop into both eyes daily as needed., Disp: 2.5 mL, Rfl: 2   ondansetron (ZOFRAN-ODT) 4 MG disintegrating tablet, ondansetron 4 mg disintegrating tablet, Disp: , Rfl:    Oxcarbazepine (TRILEPTAL) 300 MG tablet, Take by mouth., Disp: , Rfl:    pantoprazole (PROTONIX) 20 MG tablet, Take 20 mg by mouth daily., Disp: , Rfl:    SUMAtriptan (IMITREX) 100 MG tablet, Take 100 mg by mouth every 2 (two) hours as needed for migraine. May repeat in 2 hours if headache persists or recurs., Disp: , Rfl:    zolpidem (AMBIEN) 10 MG tablet, Take 10 mg by mouth at bedtime as needed for sleep., Disp: , Rfl:   Social History   Tobacco Use  Smoking Status Never  Smokeless Tobacco Never    Allergies  Allergen Reactions   Penicillins Other (See Comments) and Rash   Amoxicillin    Tramadol    Objective:  There were no vitals filed for this visit. There is no height or weight on file to calculate BMI. Constitutional Well developed. Well nourished.  Vascular Dorsalis pedis pulses palpable bilaterally. Posterior tibial pulses palpable bilaterally. Capillary refill normal to all digits.  No cyanosis or clubbing noted. Pedal hair growth normal.  Neurologic Normal speech. Oriented to person, place, and time. Epicritic sensation  to light touch grossly present bilaterally.  Dermatologic Nails well groomed and normal in appearance. No open wounds. No skin lesions.  Orthopedic: Pain on palpation left fourth metatarsophalangeal joint crepitus noted at the fourth MTP limited range of motion noted.  Pain on palpation to the joint.   Radiographs: 3 views of scheduledJoint on left foot: Osteoarthritic changes to the fourth metatarsal phalangeal joint noted.  Previous osteotomy of the fifth noted.  No other bony abnormalities identified no stress fracture or actual fracture noted Assessment:   1. Arthritis of metatarsophalangeal joint    Plan:  Patient was evaluated  and treated and all questions answered.  Left fourth MTP arthritis with underlying capsulitis -All questions and concerns were discussed with the patient.  From the misstep she may have had a little bit of contusion that flared up the arthritis is causing her pain.  She will benefit from gabapentin as her pain appears to be more nervelike in nature.  She is scheduled to undergo surgery with Dr. Sherryle Lis.  No follow-ups on file.

## 2022-06-05 NOTE — Telephone Encounter (Signed)
Patient is calling because she was walking one day ago was walking down hall way, entire top of foot began to ache.. She will keep foot elevated,ice, has  taken the day off from work. Please advise.

## 2022-06-10 DIAGNOSIS — M79676 Pain in unspecified toe(s): Secondary | ICD-10-CM

## 2022-06-17 ENCOUNTER — Encounter: Payer: 59 | Admitting: Podiatry

## 2022-06-19 ENCOUNTER — Other Ambulatory Visit: Payer: Self-pay | Admitting: Podiatry

## 2022-06-19 DIAGNOSIS — M19272 Secondary osteoarthritis, left ankle and foot: Secondary | ICD-10-CM | POA: Diagnosis not present

## 2022-06-19 MED ORDER — GABAPENTIN 300 MG PO CAPS: 300.0000 mg | ORAL_CAPSULE | Freq: Three times a day (TID) | ORAL | 0 refills | Status: AC

## 2022-06-19 MED ORDER — OXYCODONE HCL 5 MG PO TABS: 5.0000 mg | ORAL_TABLET | ORAL | 0 refills | Status: AC | PRN

## 2022-06-19 MED ORDER — IBUPROFEN 600 MG PO TABS: 600.0000 mg | ORAL_TABLET | Freq: Four times a day (QID) | ORAL | 0 refills | Status: DC | PRN

## 2022-06-19 MED ORDER — ACETAMINOPHEN 500 MG PO TABS: 1000.0000 mg | ORAL_TABLET | Freq: Four times a day (QID) | ORAL | 0 refills | Status: AC | PRN

## 2022-06-19 NOTE — Progress Notes (Signed)
06/19/22 lesser metatarsal joint replacement

## 2022-06-24 ENCOUNTER — Ambulatory Visit (INDEPENDENT_AMBULATORY_CARE_PROVIDER_SITE_OTHER): Payer: 59 | Admitting: Podiatry

## 2022-06-24 ENCOUNTER — Ambulatory Visit (INDEPENDENT_AMBULATORY_CARE_PROVIDER_SITE_OTHER): Payer: 59

## 2022-06-24 ENCOUNTER — Encounter: Payer: Self-pay | Admitting: *Deleted

## 2022-06-24 DIAGNOSIS — M19079 Primary osteoarthritis, unspecified ankle and foot: Secondary | ICD-10-CM

## 2022-06-24 DIAGNOSIS — M19072 Primary osteoarthritis, left ankle and foot: Secondary | ICD-10-CM | POA: Diagnosis not present

## 2022-06-26 NOTE — Progress Notes (Signed)
  Subjective:  Patient ID: Megan Melendez, female    DOB: August 09, 1966,  MRN: [id number]  Chief Complaint  Patient presents with   Routine Post Op      (xray)POV #1 DOS 06/19/2022 JOINT REPLACEMENT 4TH TOE LT FOOT     56 y.o. female returns for post-op check.  Doing well minimal pain  Review of Systems: Negative except as noted in the HPI. Denies N/V/F/Ch.   Objective:  There were no vitals filed for this visit. There is no height or weight on file to calculate BMI. Constitutional Well developed. Well nourished.  Vascular Foot warm and well perfused. Capillary refill normal to all digits.  Calf is soft and supple, no posterior calf or knee pain, negative Homans' sign  Neurologic Normal speech. Oriented to person, place, and time. Epicritic sensation to light touch grossly present bilaterally.  Dermatologic Skin healing well without signs of infection. Skin edges well coapted without signs of infection.  Orthopedic: Tenderness to palpation noted about the surgical site.   Multiple view plain film radiographs: Good position of implant Assessment:   1. Arthritis of metatarsophalangeal joint    Plan:  Patient was evaluated and treated and all questions answered.  S/p foot surgery left -Progressing as expected post-operatively. -XR: Noted above -WB Status: WBAT in cam walker boot -Sutures: Removed in 2 weeks  -Medications: No refills required -Foot redressed.  Return in about 2 weeks (around 07/08/2022) for post op (no x-rays), suture removal.

## 2022-07-01 ENCOUNTER — Encounter: Payer: 59 | Admitting: Podiatry

## 2022-07-08 ENCOUNTER — Ambulatory Visit (INDEPENDENT_AMBULATORY_CARE_PROVIDER_SITE_OTHER): Payer: 59 | Admitting: Podiatry

## 2022-07-08 ENCOUNTER — Encounter: Payer: 59 | Admitting: Podiatry

## 2022-07-08 DIAGNOSIS — M19079 Primary osteoarthritis, unspecified ankle and foot: Secondary | ICD-10-CM

## 2022-07-12 NOTE — Progress Notes (Signed)
  Subjective:  Patient ID: Megan Melendez, female    DOB: June 28, 1966,  MRN: [id number]  Chief Complaint  Patient presents with   Routine Post Op      POV #2 DOS 06/19/2022 JOINT REPLACEMENT 4TH TOE LT FOOT     56 y.o. female returns for post-op check.  Doing well minimal pain  Review of Systems: Negative except as noted in the HPI. Denies N/V/F/Ch.   Objective:  There were no vitals filed for this visit. There is no height or weight on file to calculate BMI. Constitutional Well developed. Well nourished.  Vascular Foot warm and well perfused. Capillary refill normal to all digits.  Calf is soft and supple, no posterior calf or knee pain, negative Homans' sign  Neurologic Normal speech. Oriented to person, place, and time. Epicritic sensation to light touch grossly present bilaterally.  Dermatologic Incision well-healed nonhypertrophic  Orthopedic: She has minimal swelling and no tenderness to palpation noted about the surgical site.   Multiple view plain film radiographs: Good position of implant Assessment:   1. Arthritis of metatarsophalangeal joint    Plan:  Patient was evaluated and treated and all questions answered.  S/p foot surgery left -Overall doing well sutures removed today.  We discussed range of motion of the toe and she will begin exercises at home.  May transition to regular shoe gear that is supportive and has a fairly stiff insole which we discussed.  I will see her back on a as needed basis  No follow-ups on file.

## 2022-07-14 ENCOUNTER — Other Ambulatory Visit: Payer: Self-pay | Admitting: Podiatry

## 2022-07-22 ENCOUNTER — Encounter: Payer: 59 | Admitting: Podiatry

## 2022-07-29 ENCOUNTER — Encounter: Payer: 59 | Admitting: Podiatry

## 2022-08-15 IMAGING — MR MR FOOT*L* W/O CM
4 of 5 series · 31 of 40 positions shown · non-contrast
Comparison: Plain films left foot 01/13/2021.

CLINICAL DATA: Pain on the plantar surface of the foot for
approximately 4 months.

EXAM:
MRI OF THE LEFT FOOT WITHOUT CONTRAST
TECHNIQUE: Multiplanar, multisequence MR imaging of the left foot was
performed. No intravenous contrast was administered.

[Series 3: T1 · coronal · left · 3.0mm · 0.31mm/px · 10 of 40 slices shown]
[im 1/40]
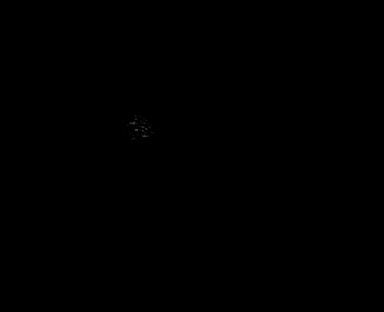
[im 4/40]
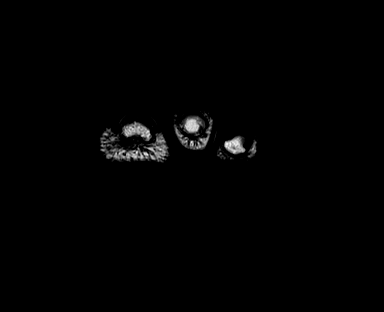
[im 8/40]
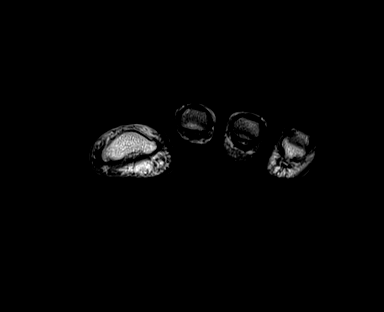
[im 12/40]
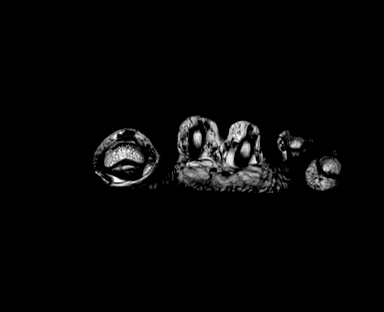
[im 16/40]
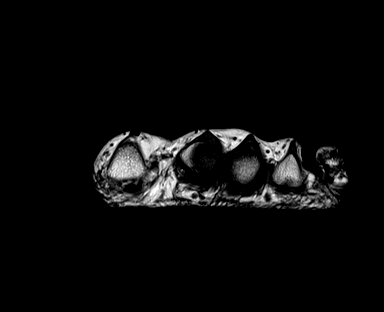
[im 20/40]
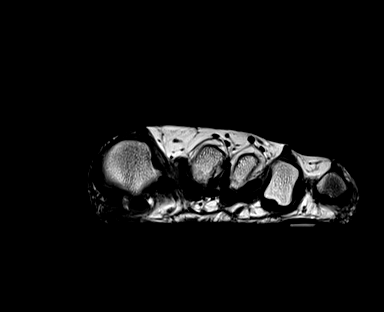
[im 24/40]
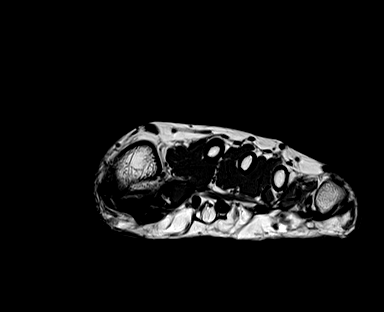
[im 28/40]
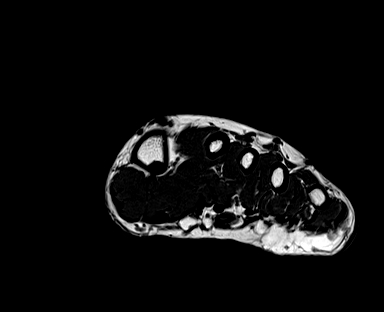
[im 32/40]
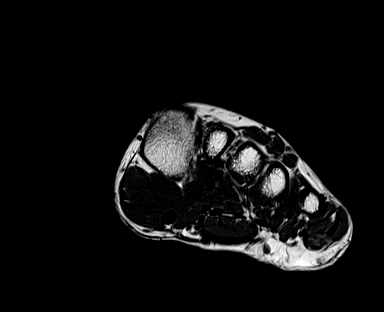
[im 36/40]
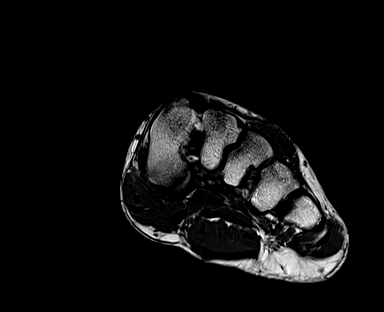

[Series 4: T2 fat-sat · coronal · left · 3.0mm · 0.27mm/px · 8 of 40 slices shown (1 of 2)]
[im 1/40]
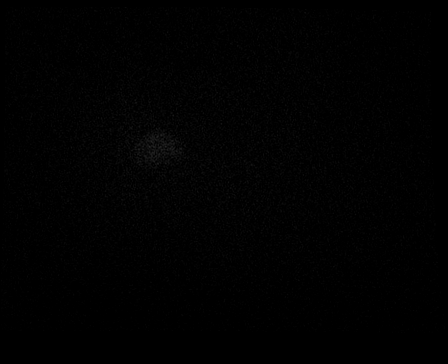
[im 5/40]
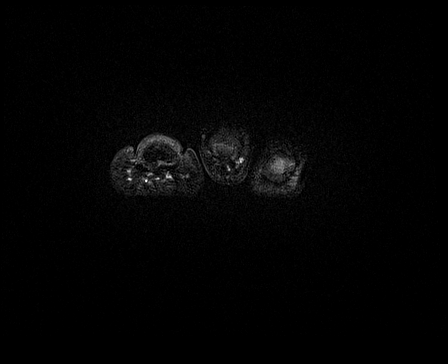
[im 14/40]
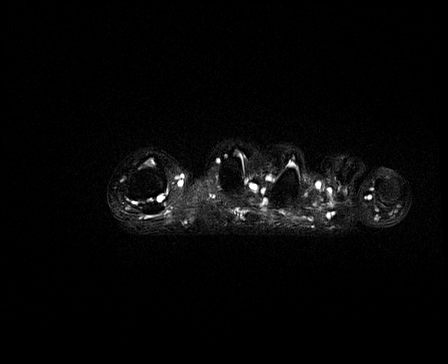
[im 18/40]
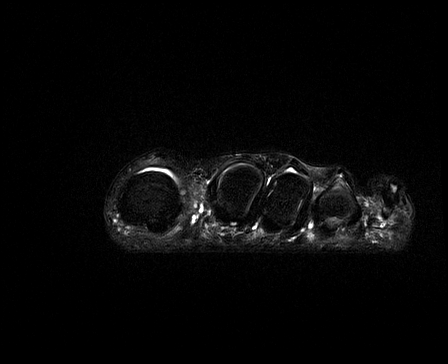
[im 22/40]
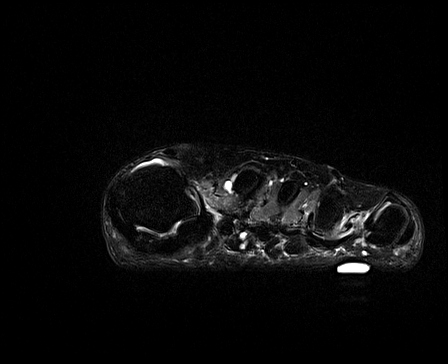
[im 27/40]
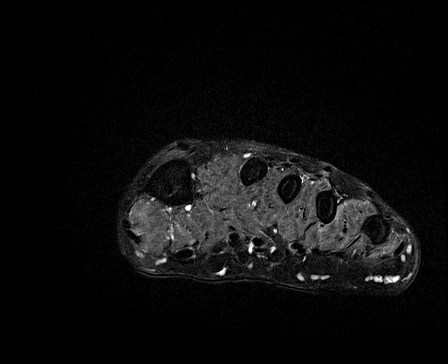
[im 35/40]
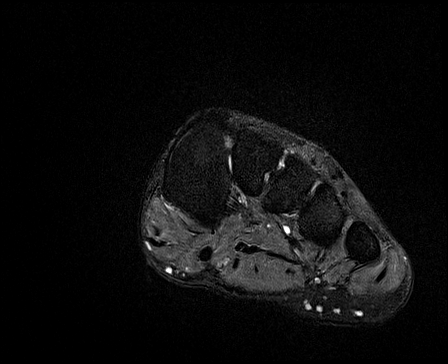
[im 40/40]
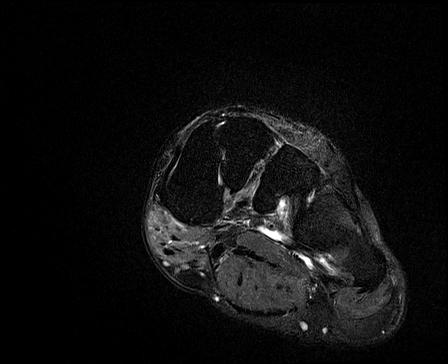

[Series 6: T2 fat-sat · axial · left · 3.0mm · 0.38mm/px · z∈[-110,-36]mm · 6 of 23 slices shown (2 of 2)]
[im 1/23]
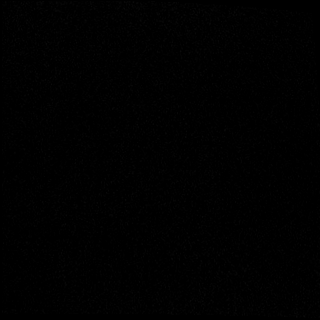
[im 5/23]
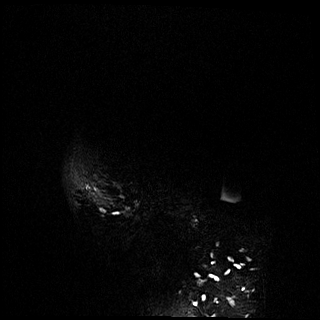
[im 9/23]
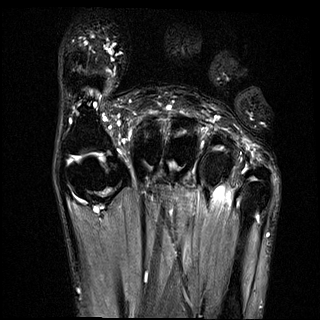
[im 14/23]
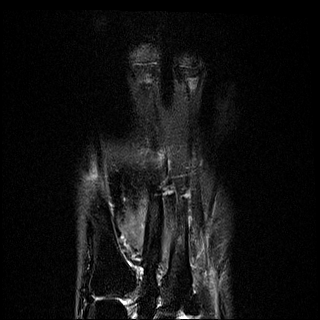
[im 18/23]
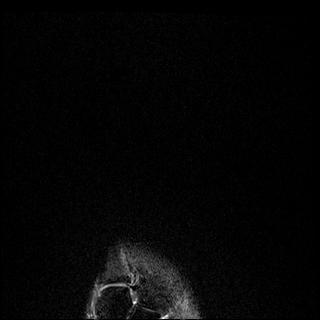
[im 23/23]
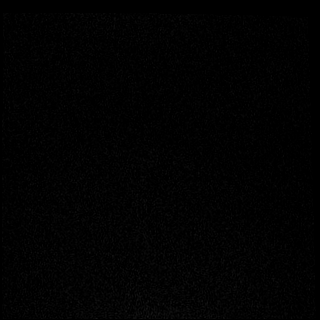

[Series 8: PD fat-sat · sagittal · left · 3.0mm · 0.23mm/px · 7 of 27 slices shown]
[im 1/27]
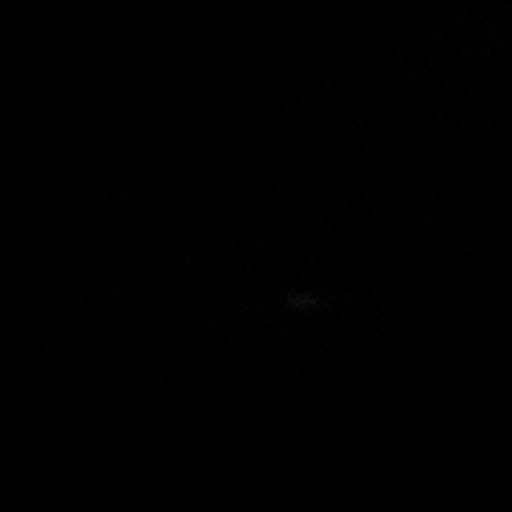
[im 5/27]
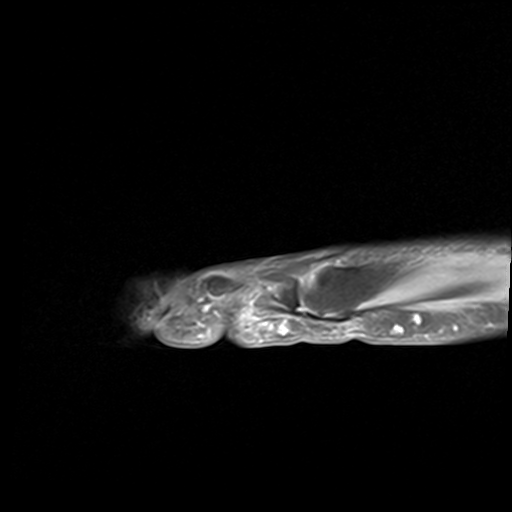
[im 9/27]
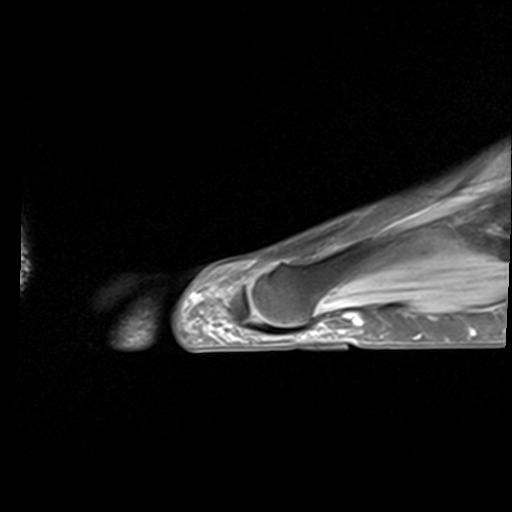
[im 14/27]
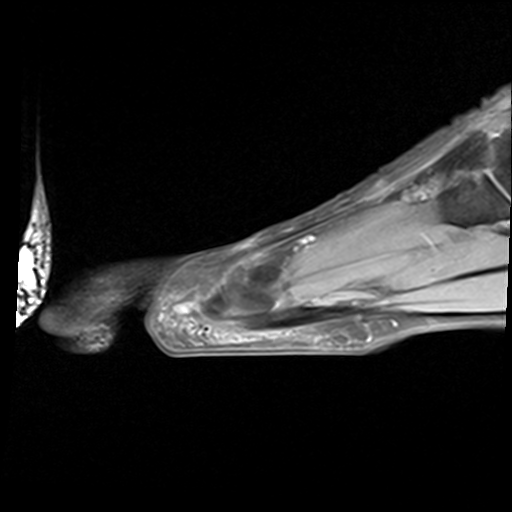
[im 18/27]
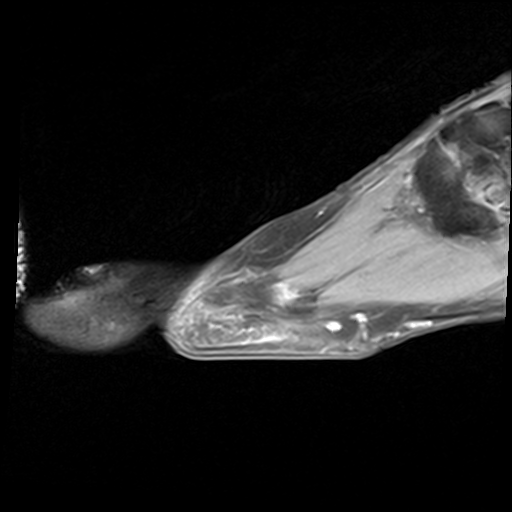
[im 22/27]
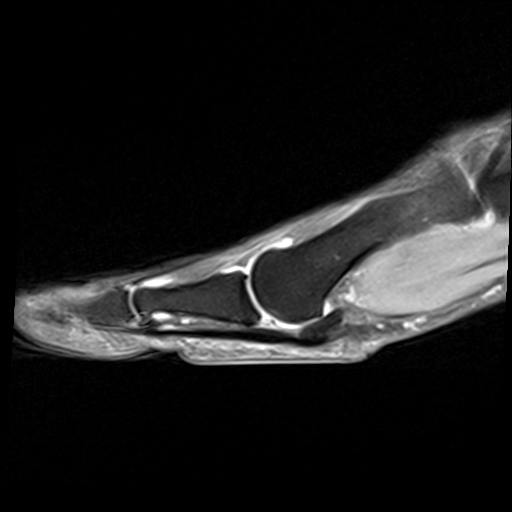
[im 27/27]
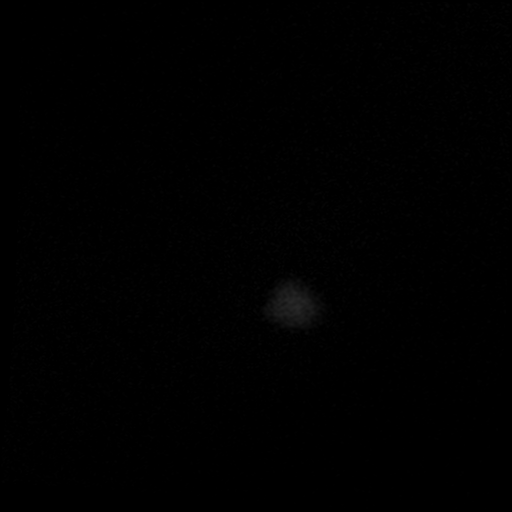

[31 of 40 positions shown; findings below may reference images not displayed]

FINDINGS: Bones/Joint/Cartilage

Marrow signal is normal throughout without fracture, stress change
or focal lesion. No erosion or osteophytosis is identified. No joint
effusion.

Ligaments

The plantar plate of the fourth MTP joint is degenerated and almost
completely torn from the base of the proximal phalanx of the fourth
toe. Collateral ligaments about the MTP joints appear intact.

Muscles and Tendons

There is edema at the musculotendinous junction of the plantar
interosseous muscles between the distal fourth and fifth metatarsals
consistent with strain. The tendon appears intact. Otherwise
negative.

Soft tissues

No mass is identified. Fluid is seen in the first intermetatarsal
space.
IMPRESSION: Degenerated plantar plate of the fourth MTP joint with high-grade
partial tear of the plate from the base of the proximal phalanx of
the fourth toe. Negative for collateral ligament tear.

Edema at the musculotendinous junction of the plantar interosseous
muscles between the distal fourth and fifth metatarsals consistent
with strain. The tendon appears intact.

Fluid in the first intermetatarsal space most compatible with
bursitis.

## 2022-10-21 ENCOUNTER — Other Ambulatory Visit: Payer: Self-pay | Admitting: Obstetrics and Gynecology

## 2022-10-21 ENCOUNTER — Other Ambulatory Visit: Payer: Self-pay | Admitting: Internal Medicine

## 2022-10-21 DIAGNOSIS — Z1231 Encounter for screening mammogram for malignant neoplasm of breast: Secondary | ICD-10-CM

## 2022-12-31 ENCOUNTER — Ambulatory Visit
Admission: RE | Admit: 2022-12-31 | Discharge: 2022-12-31 | Disposition: A | Discharge status: Home / Self Care | Payer: 59 | Source: Ambulatory Visit | Attending: Obstetrics and Gynecology | Admitting: Obstetrics and Gynecology

## 2022-12-31 DIAGNOSIS — Z1231 Encounter for screening mammogram for malignant neoplasm of breast: Secondary | ICD-10-CM | POA: Diagnosis present
